# Patient Record
Sex: Female | Born: 1955 | Race: White | Hispanic: No | Marital: Married | Smoking: Never smoker
Health system: Southern US, Community
[De-identification: ages and names within clinical notes are randomized; demographics above are authoritative.]

## PROBLEM LIST (undated history)

## (undated) DIAGNOSIS — F32A Depression, unspecified: Secondary | ICD-10-CM

## (undated) DIAGNOSIS — Z9889 Other specified postprocedural states: Secondary | ICD-10-CM

## (undated) DIAGNOSIS — I4891 Unspecified atrial fibrillation: Secondary | ICD-10-CM

## (undated) DIAGNOSIS — F329 Major depressive disorder, single episode, unspecified: Secondary | ICD-10-CM

## (undated) DIAGNOSIS — E785 Hyperlipidemia, unspecified: Secondary | ICD-10-CM

## (undated) DIAGNOSIS — K219 Gastro-esophageal reflux disease without esophagitis: Secondary | ICD-10-CM

## (undated) DIAGNOSIS — S0990XA Unspecified injury of head, initial encounter: Secondary | ICD-10-CM

## (undated) DIAGNOSIS — Z8669 Personal history of other diseases of the nervous system and sense organs: Secondary | ICD-10-CM

## (undated) DIAGNOSIS — M1990 Unspecified osteoarthritis, unspecified site: Secondary | ICD-10-CM

## (undated) DIAGNOSIS — T7840XA Allergy, unspecified, initial encounter: Secondary | ICD-10-CM

## (undated) DIAGNOSIS — I219 Acute myocardial infarction, unspecified: Secondary | ICD-10-CM

## (undated) HISTORY — DX: Allergy, unspecified, initial encounter: T78.40XA

## (undated) HISTORY — DX: Hyperlipidemia, unspecified: E78.5

## (undated) HISTORY — DX: Unspecified atrial fibrillation: I48.91

## (undated) HISTORY — PX: SPINE SURGERY: SHX786

## (undated) HISTORY — DX: Major depressive disorder, single episode, unspecified: F32.9

## (undated) HISTORY — DX: Personal history of other diseases of the nervous system and sense organs: Z86.69

## (undated) HISTORY — PX: CHOLECYSTECTOMY: SHX55

## (undated) HISTORY — DX: Gastro-esophageal reflux disease without esophagitis: K21.9

## (undated) HISTORY — PX: TUBAL LIGATION: SHX77

## (undated) HISTORY — DX: Other specified postprocedural states: Z98.890

## (undated) HISTORY — DX: Unspecified injury of head, initial encounter: S09.90XA

## (undated) HISTORY — DX: Depression, unspecified: F32.A

## (undated) HISTORY — DX: Unspecified osteoarthritis, unspecified site: M19.90

## (undated) HISTORY — DX: Acute myocardial infarction, unspecified: I21.9

---

## 1994-03-03 HISTORY — PX: OTHER SURGICAL HISTORY: SHX169

## 2003-04-27 ENCOUNTER — Ambulatory Visit (HOSPITAL_COMMUNITY): Admission: RE | Admit: 2003-04-27 | Discharge: 2003-04-28 | Payer: Self-pay | Admitting: Neurological Surgery

## 2003-06-05 ENCOUNTER — Encounter: Admission: RE | Admit: 2003-06-05 | Discharge: 2003-06-05 | Payer: Self-pay | Admitting: Neurological Surgery

## 2003-07-25 ENCOUNTER — Encounter: Admission: RE | Admit: 2003-07-25 | Discharge: 2003-07-25 | Payer: Self-pay | Admitting: Neurological Surgery

## 2004-03-11 ENCOUNTER — Ambulatory Visit: Payer: Self-pay | Admitting: Internal Medicine

## 2005-03-12 ENCOUNTER — Ambulatory Visit: Payer: Self-pay | Admitting: Internal Medicine

## 2006-05-21 ENCOUNTER — Ambulatory Visit: Payer: Self-pay | Admitting: Internal Medicine

## 2006-06-18 ENCOUNTER — Ambulatory Visit: Payer: Self-pay | Admitting: Cardiology

## 2007-08-10 ENCOUNTER — Ambulatory Visit: Payer: Self-pay | Admitting: Internal Medicine

## 2008-07-20 ENCOUNTER — Ambulatory Visit: Payer: Self-pay | Admitting: Internal Medicine

## 2008-08-15 ENCOUNTER — Ambulatory Visit: Payer: Self-pay | Admitting: Internal Medicine

## 2008-10-24 ENCOUNTER — Ambulatory Visit: Payer: Self-pay | Admitting: Unknown Physician Specialty

## 2010-02-19 ENCOUNTER — Ambulatory Visit: Payer: Self-pay | Admitting: Internal Medicine

## 2010-03-26 ENCOUNTER — Ambulatory Visit: Payer: Self-pay | Admitting: Internal Medicine

## 2010-04-10 ENCOUNTER — Ambulatory Visit: Payer: Self-pay | Admitting: Internal Medicine

## 2010-08-13 LAB — HM PAP SMEAR: HM Pap smear: NEGATIVE

## 2011-04-08 ENCOUNTER — Ambulatory Visit: Payer: Self-pay | Admitting: Internal Medicine

## 2011-04-08 LAB — HM MAMMOGRAPHY

## 2011-12-02 ENCOUNTER — Telehealth: Payer: Self-pay | Admitting: Internal Medicine

## 2011-12-02 NOTE — Telephone Encounter (Signed)
Was in [facility] for 5 days for afib  And she did a drug load of Tikosyn 500 mgs and the want blood work drawn concentrating on potassioum, magnesium and kidney function.  Was discharged on 9/28

## 2011-12-02 NOTE — Telephone Encounter (Signed)
Patient will try to have her blood work done through another avenue.

## 2012-04-13 ENCOUNTER — Encounter: Payer: Self-pay | Admitting: Internal Medicine

## 2012-04-19 ENCOUNTER — Ambulatory Visit (INDEPENDENT_AMBULATORY_CARE_PROVIDER_SITE_OTHER): Payer: BC Managed Care – PPO | Admitting: Internal Medicine

## 2012-04-19 ENCOUNTER — Encounter: Payer: Self-pay | Admitting: Internal Medicine

## 2012-04-19 VITALS — BP 120/78 | HR 62 | Temp 98.5°F | Ht 67.0 in | Wt 224.0 lb

## 2012-04-19 DIAGNOSIS — I4891 Unspecified atrial fibrillation: Secondary | ICD-10-CM

## 2012-04-19 DIAGNOSIS — F329 Major depressive disorder, single episode, unspecified: Secondary | ICD-10-CM

## 2012-04-19 DIAGNOSIS — E7800 Pure hypercholesterolemia, unspecified: Secondary | ICD-10-CM

## 2012-04-19 DIAGNOSIS — F3289 Other specified depressive episodes: Secondary | ICD-10-CM

## 2012-04-19 DIAGNOSIS — Z1239 Encounter for other screening for malignant neoplasm of breast: Secondary | ICD-10-CM

## 2012-04-19 MED ORDER — FLUOXETINE HCL 90 MG PO CPDR: 90.0000 mg | DELAYED_RELEASE_CAPSULE | ORAL | Status: DC

## 2012-04-21 ENCOUNTER — Encounter: Payer: Self-pay | Admitting: Internal Medicine

## 2012-04-21 DIAGNOSIS — I4891 Unspecified atrial fibrillation: Secondary | ICD-10-CM | POA: Insufficient documentation

## 2012-04-21 DIAGNOSIS — E7800 Pure hypercholesterolemia, unspecified: Secondary | ICD-10-CM | POA: Insufficient documentation

## 2012-04-21 NOTE — Assessment & Plan Note (Signed)
Low cholesterol diet and exercise.  Follow lipid panel.  Obtain recent lab results.

## 2012-04-21 NOTE — Assessment & Plan Note (Signed)
On prozac and doing well.  Follow.   

## 2012-04-21 NOTE — Assessment & Plan Note (Signed)
On Xarelto and Tikosyn.  Also on Toprol.  Just saw Dr Maisie Fus.  Declines repeat ablation at this time.  Follow.  Continues follow up with cardiology.
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
## 2012-04-29 ENCOUNTER — Telehealth: Payer: Self-pay | Admitting: *Deleted

## 2012-04-29 NOTE — Telephone Encounter (Signed)
With the new guidelines - I am not sure that she needs prophylactic abx.  She can call cardiology to confirm.

## 2012-04-29 NOTE — Telephone Encounter (Signed)
Patient notified. Patient notified.

## 2012-04-29 NOTE — Telephone Encounter (Signed)
Patient is going to a new dentist and would like some abx called in to pharmacy. Usually send in Amoxicillin for her prior to appointment  Rennie Plowman, [place]  Phone [phone]

## 2012-05-27 ENCOUNTER — Ambulatory Visit: Payer: Self-pay | Admitting: Internal Medicine

## 2012-05-29 ENCOUNTER — Encounter: Payer: Self-pay | Admitting: Internal Medicine

## 2012-06-15 ENCOUNTER — Encounter: Payer: Self-pay | Admitting: Internal Medicine

## 2012-07-29 ENCOUNTER — Encounter: Payer: Self-pay | Admitting: Internal Medicine

## 2012-07-29 ENCOUNTER — Ambulatory Visit (INDEPENDENT_AMBULATORY_CARE_PROVIDER_SITE_OTHER): Payer: BC Managed Care – PPO | Admitting: Internal Medicine

## 2012-07-29 ENCOUNTER — Other Ambulatory Visit (HOSPITAL_COMMUNITY)
Admission: RE | Admit: 2012-07-29 | Discharge: 2012-07-29 | Disposition: A | Discharge status: Home / Self Care | Payer: BC Managed Care – PPO | Source: Ambulatory Visit | Attending: Internal Medicine | Admitting: Internal Medicine

## 2012-07-29 VITALS — BP 104/60 | HR 61 | Temp 98.5°F | Ht 68.0 in | Wt 222.5 lb

## 2012-07-29 DIAGNOSIS — Z1151 Encounter for screening for human papillomavirus (HPV): Secondary | ICD-10-CM | POA: Insufficient documentation

## 2012-07-29 DIAGNOSIS — Z1211 Encounter for screening for malignant neoplasm of colon: Secondary | ICD-10-CM

## 2012-07-29 DIAGNOSIS — Z124 Encounter for screening for malignant neoplasm of cervix: Secondary | ICD-10-CM

## 2012-07-29 DIAGNOSIS — R109 Unspecified abdominal pain: Secondary | ICD-10-CM

## 2012-07-29 DIAGNOSIS — F329 Major depressive disorder, single episode, unspecified: Secondary | ICD-10-CM

## 2012-07-29 DIAGNOSIS — Z01419 Encounter for gynecological examination (general) (routine) without abnormal findings: Secondary | ICD-10-CM | POA: Insufficient documentation

## 2012-07-29 DIAGNOSIS — E7800 Pure hypercholesterolemia, unspecified: Secondary | ICD-10-CM

## 2012-07-29 DIAGNOSIS — I4891 Unspecified atrial fibrillation: Secondary | ICD-10-CM

## 2012-07-29 MED ORDER — PANTOPRAZOLE SODIUM 40 MG PO TBEC: 40.0000 mg | DELAYED_RELEASE_TABLET | Freq: Every day | ORAL | Status: DC

## 2012-08-01 ENCOUNTER — Encounter: Payer: Self-pay | Admitting: Internal Medicine

## 2012-08-01 NOTE — Assessment & Plan Note (Signed)
Low cholesterol diet and exercise.  Follow lipid panel.  Obtain recent lab results.
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
## 2012-08-01 NOTE — Assessment & Plan Note (Signed)
On Xarelto and Tikosyn.  Also on Toprol.  Sees Dr Maisie Fus.  Has discussed with him regarding another ablation.  She has previously declined. Contemplating now.  Continues to follow up with cardiology.

## 2012-08-01 NOTE — Assessment & Plan Note (Signed)
On prozac and doing well.  Follow.   

## 2012-08-02 ENCOUNTER — Ambulatory Visit: Payer: Self-pay | Admitting: Internal Medicine

## 2012-08-03 ENCOUNTER — Encounter: Payer: Self-pay | Admitting: *Deleted

## 2012-08-11 ENCOUNTER — Telehealth: Payer: Self-pay | Admitting: *Deleted

## 2012-08-11 DIAGNOSIS — R109 Unspecified abdominal pain: Secondary | ICD-10-CM

## 2012-08-11 DIAGNOSIS — K760 Fatty (change of) liver, not elsewhere classified: Secondary | ICD-10-CM

## 2012-08-11 NOTE — Telephone Encounter (Signed)
Pt informed of Abdominal U/S results- U/S showed a fatty liver. Also a possible non obstructing right kidney stone. Pt willing to proceed with the GI referral to further evaluate the abdominal pain & fatty liver. Pt has seen Dr. Mechele Collin at [facility] in the past & would like to see him again if possible. **Needs GI referral**

## 2012-08-12 NOTE — Telephone Encounter (Signed)
Order placed for GI referral.   

## 2012-09-01 ENCOUNTER — Encounter: Payer: Self-pay | Admitting: Internal Medicine

## 2012-09-16 ENCOUNTER — Encounter: Payer: Self-pay | Admitting: Internal Medicine

## 2012-09-28 ENCOUNTER — Encounter: Payer: Self-pay | Admitting: Internal Medicine

## 2012-09-28 ENCOUNTER — Ambulatory Visit (INDEPENDENT_AMBULATORY_CARE_PROVIDER_SITE_OTHER): Payer: BC Managed Care – PPO | Admitting: Internal Medicine

## 2012-09-28 VITALS — BP 110/64 | HR 61 | Temp 98.1°F | Ht 68.0 in | Wt 222.5 lb

## 2012-09-28 DIAGNOSIS — K760 Fatty (change of) liver, not elsewhere classified: Secondary | ICD-10-CM

## 2012-09-28 DIAGNOSIS — I4891 Unspecified atrial fibrillation: Secondary | ICD-10-CM

## 2012-09-28 DIAGNOSIS — K7689 Other specified diseases of liver: Secondary | ICD-10-CM

## 2012-09-28 DIAGNOSIS — F329 Major depressive disorder, single episode, unspecified: Secondary | ICD-10-CM

## 2012-09-28 DIAGNOSIS — E7800 Pure hypercholesterolemia, unspecified: Secondary | ICD-10-CM

## 2012-10-02 ENCOUNTER — Encounter: Payer: Self-pay | Admitting: Internal Medicine

## 2012-10-02 DIAGNOSIS — K760 Fatty (change of) liver, not elsewhere classified: Secondary | ICD-10-CM | POA: Insufficient documentation

## 2012-10-02 NOTE — Assessment & Plan Note (Signed)
On prozac and doing well.  Follow.   
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
## 2012-10-02 NOTE — Assessment & Plan Note (Signed)
Low cholesterol diet and exercise.  Follow lipid panel.  Check cholesterol.

## 2012-10-02 NOTE — Assessment & Plan Note (Signed)
On Xarelto and Tikosyn.  Also on Toprol.  Sees Dr Maisie Fus.  Has discussed with him regarding another ablation.  She has previously declined. Contemplating now.  Continues to follow up with cardiology.

## 2012-10-02 NOTE — Assessment & Plan Note (Signed)
Abdominal ultrasound revealed changes c/w fatty liver.  Check liver panel.  Discussed the need for hepatitis vaccines.

## 2012-10-14 ENCOUNTER — Other Ambulatory Visit (INDEPENDENT_AMBULATORY_CARE_PROVIDER_SITE_OTHER): Payer: BC Managed Care – PPO

## 2012-10-14 DIAGNOSIS — E7800 Pure hypercholesterolemia, unspecified: Secondary | ICD-10-CM

## 2012-10-14 DIAGNOSIS — K760 Fatty (change of) liver, not elsewhere classified: Secondary | ICD-10-CM

## 2012-10-14 DIAGNOSIS — K7689 Other specified diseases of liver: Secondary | ICD-10-CM

## 2012-10-14 DIAGNOSIS — I4891 Unspecified atrial fibrillation: Secondary | ICD-10-CM

## 2012-10-14 LAB — TSH: TSH: 1.68 u[IU]/mL (ref 0.35–5.50)

## 2012-10-14 LAB — COMPREHENSIVE METABOLIC PANEL
ALT: 25 U/L (ref 0–35)
AST: 24 U/L (ref 0–37)
Albumin: 4 g/dL (ref 3.5–5.2)
Alkaline Phosphatase: 89 U/L (ref 39–117)
BUN: 15 mg/dL (ref 6–23)
CO2: 27 mEq/L (ref 19–32)
Calcium: 9.2 mg/dL (ref 8.4–10.5)
Chloride: 101 mEq/L (ref 96–112)
Creatinine, Ser: 0.7 mg/dL (ref 0.4–1.2)
GFR: 95.69 mL/min (ref >60.00)
Glucose, Bld: 91 mg/dL (ref 70–99)
Potassium: 4.5 mEq/L (ref 3.5–5.1)
Sodium: 136 mEq/L (ref 135–145)
Total Bilirubin: 0.6 mg/dL (ref 0.3–1.2)
Total Protein: 7.4 g/dL (ref 6.0–8.3)

## 2012-10-14 LAB — CBC WITH DIFFERENTIAL/PLATELET
Basophils Absolute: 0.1 10*3/uL (ref 0.0–0.1)
Basophils Relative: 1.2 % (ref 0.0–3.0)
Eosinophils Absolute: 0.2 10*3/uL (ref 0.0–0.7)
Eosinophils Relative: 2.3 % (ref 0.0–5.0)
HCT: 39.7 % (ref 36.0–46.0)
Hemoglobin: 13.4 g/dL (ref 12.0–15.0)
Lymphocytes Relative: 26.8 % (ref 12.0–46.0)
Lymphs Abs: 1.8 10*3/uL (ref 0.7–4.0)
MCHC: 33.8 g/dL (ref 30.0–36.0)
MCV: 86.7 fl (ref 78.0–100.0)
Monocytes Absolute: 0.6 10*3/uL (ref 0.1–1.0)
Monocytes Relative: 8.8 % (ref 3.0–12.0)
Neutro Abs: 4.1 10*3/uL (ref 1.4–7.7)
Neutrophils Relative %: 60.9 % (ref 43.0–77.0)
Platelets: 261 10*3/uL (ref 150.0–400.0)
RBC: 4.58 Mil/uL (ref 3.87–5.11)
RDW: 13.8 % (ref 11.5–14.6)
WBC: 6.7 10*3/uL (ref 4.5–10.5)

## 2012-10-14 LAB — LIPID PANEL
Cholesterol: 199 mg/dL (ref 0–200)
HDL: 59.3 mg/dL (ref >39.00)
LDL Cholesterol: 112 mg/dL — ABNORMAL HIGH (ref 0–99)
Total CHOL/HDL Ratio: 3
Triglycerides: 137 mg/dL (ref 0.0–149.0)
VLDL: 27.4 mg/dL (ref 0.0–40.0)

## 2012-10-15 ENCOUNTER — Encounter: Payer: Self-pay | Admitting: *Deleted

## 2012-10-19 ENCOUNTER — Encounter: Payer: BC Managed Care – PPO | Admitting: Internal Medicine

## 2012-10-21 ENCOUNTER — Encounter: Payer: BC Managed Care – PPO | Admitting: Internal Medicine

## 2012-12-13 ENCOUNTER — Other Ambulatory Visit: Payer: Self-pay | Admitting: Internal Medicine

## 2012-12-13 NOTE — Telephone Encounter (Signed)
Refilled weekly prozac #4 with 5 refills.

## 2012-12-13 NOTE — Telephone Encounter (Signed)
Okay to refill? 

## 2013-02-01 ENCOUNTER — Encounter: Payer: Self-pay | Admitting: Internal Medicine

## 2013-02-01 ENCOUNTER — Ambulatory Visit (INDEPENDENT_AMBULATORY_CARE_PROVIDER_SITE_OTHER): Payer: BC Managed Care – PPO | Admitting: Internal Medicine

## 2013-02-01 ENCOUNTER — Encounter (INDEPENDENT_AMBULATORY_CARE_PROVIDER_SITE_OTHER): Payer: Self-pay

## 2013-02-01 VITALS — BP 110/70 | HR 66 | Temp 98.0°F | Ht 68.0 in | Wt 221.5 lb

## 2013-02-01 DIAGNOSIS — E7800 Pure hypercholesterolemia, unspecified: Secondary | ICD-10-CM

## 2013-02-01 DIAGNOSIS — K760 Fatty (change of) liver, not elsewhere classified: Secondary | ICD-10-CM

## 2013-02-01 DIAGNOSIS — K7689 Other specified diseases of liver: Secondary | ICD-10-CM

## 2013-02-01 DIAGNOSIS — I4891 Unspecified atrial fibrillation: Secondary | ICD-10-CM

## 2013-02-01 DIAGNOSIS — R112 Nausea with vomiting, unspecified: Secondary | ICD-10-CM

## 2013-02-01 DIAGNOSIS — F329 Major depressive disorder, single episode, unspecified: Secondary | ICD-10-CM

## 2013-02-01 MED ORDER — PANTOPRAZOLE SODIUM 40 MG PO TBEC: 40.0000 mg | DELAYED_RELEASE_TABLET | Freq: Every day | ORAL | Status: DC
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
## 2013-02-01 NOTE — Progress Notes (Signed)
Pre-visit discussion using our clinic review tool. No additional management support is needed unless otherwise documented below in the visit note.  

## 2013-02-05 ENCOUNTER — Encounter: Payer: Self-pay | Admitting: Internal Medicine

## 2013-02-05 DIAGNOSIS — R112 Nausea with vomiting, unspecified: Secondary | ICD-10-CM | POA: Insufficient documentation

## 2013-02-05 NOTE — Assessment & Plan Note (Signed)
Low cholesterol diet and exercise.  Follow lipid panel.   

## 2013-02-05 NOTE — Assessment & Plan Note (Signed)
Symptoms improved on protonix.  Has had an abdominal ultrasound.  No acute abnormality.  Has seen GI.  Will restart protonix.  Notify me if persistent problems.  She declines further evaluation at this point.

## 2013-02-05 NOTE — Assessment & Plan Note (Signed)
On prozac and doing well.  Follow.   

## 2013-02-05 NOTE — Assessment & Plan Note (Signed)
Abdominal ultrasound revealed changes c/w fatty liver.  Follow liver panel.  Discussed the need for hepatitis vaccines.

## 2013-02-05 NOTE — Assessment & Plan Note (Signed)
On Xarelto and Tikosyn.  Also on Toprol.  Sees Dr Maisie Fus.  Has discussed with him regarding another ablation.  She declines.  Continues to follow up with cardiology.

## 2013-05-30 ENCOUNTER — Other Ambulatory Visit: Payer: Self-pay | Admitting: Internal Medicine

## 2013-08-24 ENCOUNTER — Other Ambulatory Visit: Payer: Self-pay | Admitting: Internal Medicine

## 2013-08-25 NOTE — Telephone Encounter (Signed)
Last OV 12.2.14, last refill 5.26.15.  Please advise refill.

## 2013-08-25 NOTE — Telephone Encounter (Signed)
Refilled prozac #4 with one refill.

## 2013-10-03 ENCOUNTER — Encounter (INDEPENDENT_AMBULATORY_CARE_PROVIDER_SITE_OTHER): Payer: Self-pay

## 2013-10-03 ENCOUNTER — Encounter: Payer: Self-pay | Admitting: Internal Medicine

## 2013-10-03 ENCOUNTER — Ambulatory Visit (INDEPENDENT_AMBULATORY_CARE_PROVIDER_SITE_OTHER): Payer: No Typology Code available for payment source | Admitting: Internal Medicine

## 2013-10-03 VITALS — BP 118/70 | HR 60 | Temp 98.1°F | Ht 67.0 in | Wt 222.0 lb

## 2013-10-03 DIAGNOSIS — Z1239 Encounter for other screening for malignant neoplasm of breast: Secondary | ICD-10-CM

## 2013-10-03 DIAGNOSIS — K760 Fatty (change of) liver, not elsewhere classified: Secondary | ICD-10-CM

## 2013-10-03 DIAGNOSIS — R5383 Other fatigue: Secondary | ICD-10-CM

## 2013-10-03 DIAGNOSIS — E669 Obesity, unspecified: Secondary | ICD-10-CM

## 2013-10-03 DIAGNOSIS — F3289 Other specified depressive episodes: Secondary | ICD-10-CM

## 2013-10-03 DIAGNOSIS — R112 Nausea with vomiting, unspecified: Secondary | ICD-10-CM

## 2013-10-03 DIAGNOSIS — I4891 Unspecified atrial fibrillation: Secondary | ICD-10-CM

## 2013-10-03 DIAGNOSIS — K7689 Other specified diseases of liver: Secondary | ICD-10-CM

## 2013-10-03 DIAGNOSIS — E7800 Pure hypercholesterolemia, unspecified: Secondary | ICD-10-CM

## 2013-10-03 DIAGNOSIS — R5381 Other malaise: Secondary | ICD-10-CM

## 2013-10-03 DIAGNOSIS — F32A Depression, unspecified: Secondary | ICD-10-CM

## 2013-10-03 DIAGNOSIS — F329 Major depressive disorder, single episode, unspecified: Secondary | ICD-10-CM

## 2013-10-03 MED ORDER — FLUOXETINE HCL 90 MG PO CPDR: 90.0000 mg | DELAYED_RELEASE_CAPSULE | ORAL | Status: DC

## 2013-10-03 NOTE — Progress Notes (Signed)
Pre visit review using our clinic review tool, if applicable. No additional management support is needed unless otherwise documented below in the visit note. 

## 2013-10-04 ENCOUNTER — Encounter: Payer: Self-pay | Admitting: Internal Medicine

## 2013-10-04 DIAGNOSIS — E669 Obesity, unspecified: Secondary | ICD-10-CM | POA: Insufficient documentation

## 2013-10-04 NOTE — Assessment & Plan Note (Signed)
Low cholesterol diet and exercise.  Follow lipid panel.   

## 2013-10-04 NOTE — Assessment & Plan Note (Signed)
Abdominal ultrasound revealed changes c/w fatty liver.  Follow liver panel.  Have discussed the need for hepatitis vaccines, diet and exercise.

## 2013-10-04 NOTE — Assessment & Plan Note (Signed)
On prozac and doing well.  Follow.   

## 2013-10-04 NOTE — Assessment & Plan Note (Signed)
Symptoms improved on protonix.  Has had an abdominal ultrasound.  No acute abnormality.  Has seen GI.  Will restart protonix.  When on protonix, symptoms appear to be better controlled.  Some reflux.  When off protonix, symptoms worse.  She is due f/u colonoscopy.  Will refer for possible EGD to be done at the same time.

## 2013-10-04 NOTE — Assessment & Plan Note (Signed)
Have discussed diet, exercise and weight loss.   
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
## 2013-10-04 NOTE — Assessment & Plan Note (Signed)
On Xarelto and Tikosyn.  Also on Toprol.  Has seen Dr Maisie Fushomas.  Has discussed with him regarding another ablation.  She has previously declined.  Given ongoing symptoms, we discussed referral back.  States she will think about pursuing.   Continues to follow up with cardiology.  Planned f/u in 10/15 with ECHO.

## 2013-10-11 ENCOUNTER — Other Ambulatory Visit (INDEPENDENT_AMBULATORY_CARE_PROVIDER_SITE_OTHER): Payer: No Typology Code available for payment source

## 2013-10-11 DIAGNOSIS — E7800 Pure hypercholesterolemia, unspecified: Secondary | ICD-10-CM

## 2013-10-11 DIAGNOSIS — I4891 Unspecified atrial fibrillation: Secondary | ICD-10-CM

## 2013-10-11 DIAGNOSIS — R5381 Other malaise: Secondary | ICD-10-CM

## 2013-10-11 DIAGNOSIS — R5383 Other fatigue: Secondary | ICD-10-CM

## 2013-10-11 LAB — COMPREHENSIVE METABOLIC PANEL
ALT: 37 U/L — ABNORMAL HIGH (ref 0–35)
AST: 28 U/L (ref 0–37)
Albumin: 4.1 g/dL (ref 3.5–5.2)
Alkaline Phosphatase: 92 U/L (ref 39–117)
BUN: 12 mg/dL (ref 6–23)
CO2: 25 mEq/L (ref 19–32)
Calcium: 9.3 mg/dL (ref 8.4–10.5)
Chloride: 101 mEq/L (ref 96–112)
Creatinine, Ser: 0.7 mg/dL (ref 0.4–1.2)
GFR: 90.67 mL/min (ref >60.00)
Glucose, Bld: 88 mg/dL (ref 70–99)
Potassium: 4.6 mEq/L (ref 3.5–5.1)
Sodium: 136 mEq/L (ref 135–145)
Total Bilirubin: 0.6 mg/dL (ref 0.2–1.2)
Total Protein: 7 g/dL (ref 6.0–8.3)

## 2013-10-11 LAB — CBC WITH DIFFERENTIAL/PLATELET
Basophils Absolute: 0 10*3/uL (ref 0.0–0.1)
Basophils Relative: 0.5 % (ref 0.0–3.0)
Eosinophils Absolute: 0.1 10*3/uL (ref 0.0–0.7)
Eosinophils Relative: 1.7 % (ref 0.0–5.0)
HCT: 39.6 % (ref 36.0–46.0)
Hemoglobin: 13.2 g/dL (ref 12.0–15.0)
Lymphocytes Relative: 28.1 % (ref 12.0–46.0)
Lymphs Abs: 1.7 10*3/uL (ref 0.7–4.0)
MCHC: 33.3 g/dL (ref 30.0–36.0)
MCV: 86.6 fl (ref 78.0–100.0)
Monocytes Absolute: 0.5 10*3/uL (ref 0.1–1.0)
Monocytes Relative: 8.2 % (ref 3.0–12.0)
Neutro Abs: 3.8 10*3/uL (ref 1.4–7.7)
Neutrophils Relative %: 61.5 % (ref 43.0–77.0)
Platelets: 253 10*3/uL (ref 150.0–400.0)
RBC: 4.57 Mil/uL (ref 3.87–5.11)
RDW: 14.4 % (ref 11.5–15.5)
WBC: 6.1 10*3/uL (ref 4.0–10.5)

## 2013-10-11 LAB — TSH: TSH: 1.48 u[IU]/mL (ref 0.35–4.50)

## 2013-10-11 LAB — LIPID PANEL
Cholesterol: 222 mg/dL — ABNORMAL HIGH (ref 0–200)
HDL: 51.6 mg/dL (ref >39.00)
LDL Cholesterol: 137 mg/dL — ABNORMAL HIGH (ref 0–99)
NonHDL: 170.4
Total CHOL/HDL Ratio: 4
Triglycerides: 165 mg/dL — ABNORMAL HIGH (ref 0.0–149.0)
VLDL: 33 mg/dL (ref 0.0–40.0)

## 2013-10-12 ENCOUNTER — Other Ambulatory Visit: Payer: Self-pay | Admitting: Internal Medicine

## 2013-10-12 ENCOUNTER — Encounter: Payer: Self-pay | Admitting: *Deleted

## 2013-10-12 DIAGNOSIS — R945 Abnormal results of liver function studies: Secondary | ICD-10-CM

## 2013-10-12 DIAGNOSIS — R7989 Other specified abnormal findings of blood chemistry: Secondary | ICD-10-CM

## 2013-10-12 NOTE — Progress Notes (Signed)
Order placed for f/u liver panel.  

## 2013-10-14 ENCOUNTER — Encounter: Payer: Self-pay | Admitting: Internal Medicine

## 2013-11-02 ENCOUNTER — Other Ambulatory Visit: Payer: No Typology Code available for payment source

## 2013-11-14 ENCOUNTER — Other Ambulatory Visit (INDEPENDENT_AMBULATORY_CARE_PROVIDER_SITE_OTHER): Payer: No Typology Code available for payment source

## 2013-11-14 DIAGNOSIS — R945 Abnormal results of liver function studies: Secondary | ICD-10-CM

## 2013-11-14 DIAGNOSIS — R7989 Other specified abnormal findings of blood chemistry: Secondary | ICD-10-CM

## 2013-11-14 LAB — HEPATIC FUNCTION PANEL
ALT: 36 U/L — ABNORMAL HIGH (ref 0–35)
AST: 32 U/L (ref 0–37)
Albumin: 4 g/dL (ref 3.5–5.2)
Alkaline Phosphatase: 101 U/L (ref 39–117)
Bilirubin, Direct: 0 mg/dL (ref 0.0–0.3)
Total Bilirubin: 0.5 mg/dL (ref 0.2–1.2)
Total Protein: 7.6 g/dL (ref 6.0–8.3)

## 2013-11-21 ENCOUNTER — Other Ambulatory Visit: Payer: Self-pay | Admitting: Internal Medicine

## 2013-11-21 ENCOUNTER — Encounter: Payer: Self-pay | Admitting: *Deleted

## 2013-11-21 DIAGNOSIS — R7989 Other specified abnormal findings of blood chemistry: Secondary | ICD-10-CM

## 2013-11-21 DIAGNOSIS — R945 Abnormal results of liver function studies: Secondary | ICD-10-CM

## 2013-11-21 NOTE — Progress Notes (Signed)
Order placed for f/u liver panel.  

## 2014-01-23 ENCOUNTER — Other Ambulatory Visit (INDEPENDENT_AMBULATORY_CARE_PROVIDER_SITE_OTHER): Payer: No Typology Code available for payment source

## 2014-01-23 DIAGNOSIS — R945 Abnormal results of liver function studies: Secondary | ICD-10-CM

## 2014-01-23 DIAGNOSIS — R7989 Other specified abnormal findings of blood chemistry: Secondary | ICD-10-CM

## 2014-01-23 LAB — HEPATIC FUNCTION PANEL
ALT: 34 U/L (ref 0–35)
AST: 31 U/L (ref 0–37)
Albumin: 4.2 g/dL (ref 3.5–5.2)
Alkaline Phosphatase: 101 U/L (ref 39–117)
Bilirubin, Direct: 0.1 mg/dL (ref 0.0–0.3)
Total Bilirubin: 0.4 mg/dL (ref 0.2–1.2)
Total Protein: 7.1 g/dL (ref 6.0–8.3)

## 2014-01-24 ENCOUNTER — Other Ambulatory Visit: Payer: No Typology Code available for payment source

## 2014-01-24 ENCOUNTER — Encounter: Payer: Self-pay | Admitting: *Deleted

## 2014-02-20 ENCOUNTER — Ambulatory Visit: Payer: Self-pay | Admitting: Unknown Physician Specialty

## 2014-02-20 LAB — HM COLONOSCOPY

## 2014-03-10 ENCOUNTER — Encounter: Payer: Self-pay | Admitting: Internal Medicine

## 2014-04-02 ENCOUNTER — Encounter: Payer: Self-pay | Admitting: Internal Medicine

## 2014-04-02 DIAGNOSIS — K5730 Diverticulosis of large intestine without perforation or abscess without bleeding: Secondary | ICD-10-CM | POA: Insufficient documentation

## 2014-04-04 ENCOUNTER — Encounter: Payer: Self-pay | Admitting: Internal Medicine

## 2014-04-04 ENCOUNTER — Ambulatory Visit (INDEPENDENT_AMBULATORY_CARE_PROVIDER_SITE_OTHER): Payer: No Typology Code available for payment source | Admitting: Internal Medicine

## 2014-04-04 VITALS — BP 106/70 | HR 88 | Temp 98.2°F | Ht 67.0 in | Wt 217.2 lb

## 2014-04-04 DIAGNOSIS — H5789 Other specified disorders of eye and adnexa: Secondary | ICD-10-CM

## 2014-04-04 DIAGNOSIS — F329 Major depressive disorder, single episode, unspecified: Secondary | ICD-10-CM

## 2014-04-04 DIAGNOSIS — K760 Fatty (change of) liver, not elsewhere classified: Secondary | ICD-10-CM

## 2014-04-04 DIAGNOSIS — E7800 Pure hypercholesterolemia, unspecified: Secondary | ICD-10-CM

## 2014-04-04 DIAGNOSIS — H578 Other specified disorders of eye and adnexa: Secondary | ICD-10-CM

## 2014-04-04 DIAGNOSIS — K5730 Diverticulosis of large intestine without perforation or abscess without bleeding: Secondary | ICD-10-CM

## 2014-04-04 DIAGNOSIS — Z1239 Encounter for other screening for malignant neoplasm of breast: Secondary | ICD-10-CM

## 2014-04-04 DIAGNOSIS — I4891 Unspecified atrial fibrillation: Secondary | ICD-10-CM

## 2014-04-04 DIAGNOSIS — F32A Depression, unspecified: Secondary | ICD-10-CM

## 2014-04-04 DIAGNOSIS — H5712 Ocular pain, left eye: Secondary | ICD-10-CM

## 2014-04-04 DIAGNOSIS — E669 Obesity, unspecified: Secondary | ICD-10-CM

## 2014-04-04 NOTE — Progress Notes (Signed)
Pre visit review using our clinic review tool, if applicable. No additional management support is needed unless otherwise documented below in the visit note. 

## 2014-04-07 ENCOUNTER — Encounter: Payer: Self-pay | Admitting: Internal Medicine

## 2014-04-07 DIAGNOSIS — E669 Obesity, unspecified: Secondary | ICD-10-CM | POA: Insufficient documentation

## 2014-04-07 DIAGNOSIS — H5789 Other specified disorders of eye and adnexa: Secondary | ICD-10-CM | POA: Insufficient documentation

## 2014-04-07 NOTE — Assessment & Plan Note (Signed)
Doing well on prozac.  Follow  

## 2014-04-07 NOTE — Assessment & Plan Note (Signed)
Persistent issues.  Seeing Dr Maureen RalphsVivian.  Planning for ablation 04/12/14.

## 2014-04-07 NOTE — Assessment & Plan Note (Signed)
Abdominal ultrasound - changes c/w fatty liver.  Check liver panel.  Diet and exercise.

## 2014-04-07 NOTE — Assessment & Plan Note (Signed)
Diet and exercise.   
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
## 2014-04-07 NOTE — Assessment & Plan Note (Signed)
Redness and swelling s/p splash exposure - dog ear cleaner.  She has rinsed her eye.  Still with some irritation and haziness.  Have ophthalmology evaluate.

## 2014-04-07 NOTE — Assessment & Plan Note (Signed)
Low cholesterol diet and exercise.  Check lipid panel.   

## 2014-04-07 NOTE — Assessment & Plan Note (Signed)
Colonoscopy 02/20/14 - diverticulosis and internal hemorrhoids.  F/u colonoscopy in 2020.  Bowels better.

## 2014-05-02 ENCOUNTER — Ambulatory Visit: Payer: Self-pay | Admitting: Internal Medicine

## 2014-05-02 LAB — HM MAMMOGRAPHY: HM Mammogram: NEGATIVE

## 2014-05-15 ENCOUNTER — Other Ambulatory Visit: Payer: Self-pay | Admitting: *Deleted

## 2014-05-15 MED ORDER — FLUOXETINE HCL 90 MG PO CPDR: 90.0000 mg | DELAYED_RELEASE_CAPSULE | ORAL | Status: DC

## 2014-06-26 LAB — SURGICAL PATHOLOGY

## 2014-08-03 ENCOUNTER — Other Ambulatory Visit: Payer: Self-pay | Admitting: Internal Medicine

## 2014-10-03 ENCOUNTER — Encounter: Payer: No Typology Code available for payment source | Admitting: Internal Medicine

## 2014-10-06 ENCOUNTER — Encounter: Payer: Self-pay | Admitting: Internal Medicine

## 2014-10-06 ENCOUNTER — Ambulatory Visit (INDEPENDENT_AMBULATORY_CARE_PROVIDER_SITE_OTHER): Payer: PRIVATE HEALTH INSURANCE | Admitting: Internal Medicine

## 2014-10-06 VITALS — BP 110/80 | HR 52 | Temp 98.2°F | Ht 67.25 in | Wt 207.4 lb

## 2014-10-06 DIAGNOSIS — K5730 Diverticulosis of large intestine without perforation or abscess without bleeding: Secondary | ICD-10-CM

## 2014-10-06 DIAGNOSIS — I4891 Unspecified atrial fibrillation: Secondary | ICD-10-CM | POA: Diagnosis not present

## 2014-10-06 DIAGNOSIS — F32A Depression, unspecified: Secondary | ICD-10-CM

## 2014-10-06 DIAGNOSIS — F329 Major depressive disorder, single episode, unspecified: Secondary | ICD-10-CM

## 2014-10-06 DIAGNOSIS — Z0000 Encounter for general adult medical examination without abnormal findings: Secondary | ICD-10-CM

## 2014-10-06 DIAGNOSIS — K760 Fatty (change of) liver, not elsewhere classified: Secondary | ICD-10-CM

## 2014-10-06 DIAGNOSIS — E669 Obesity, unspecified: Secondary | ICD-10-CM

## 2014-10-06 DIAGNOSIS — E7800 Pure hypercholesterolemia, unspecified: Secondary | ICD-10-CM

## 2014-10-06 LAB — BASIC METABOLIC PANEL
BUN: 12 mg/dL (ref 6–23)
CO2: 27 mEq/L (ref 19–32)
Calcium: 9.3 mg/dL (ref 8.4–10.5)
Chloride: 102 mEq/L (ref 96–112)
Creatinine, Ser: 0.61 mg/dL (ref 0.40–1.20)
GFR: 105.92 mL/min (ref >60.00)
Glucose, Bld: 86 mg/dL (ref 70–99)
Potassium: 4.9 mEq/L (ref 3.5–5.1)
Sodium: 137 mEq/L (ref 135–145)

## 2014-10-06 LAB — CBC WITH DIFFERENTIAL/PLATELET
Basophils Absolute: 0 10*3/uL (ref 0.0–0.1)
Basophils Relative: 0.7 % (ref 0.0–3.0)
Eosinophils Absolute: 0.1 10*3/uL (ref 0.0–0.7)
Eosinophils Relative: 2 % (ref 0.0–5.0)
HCT: 39.7 % (ref 36.0–46.0)
Hemoglobin: 13.3 g/dL (ref 12.0–15.0)
Lymphocytes Relative: 30.8 % (ref 12.0–46.0)
Lymphs Abs: 2 10*3/uL (ref 0.7–4.0)
MCHC: 33.6 g/dL (ref 30.0–36.0)
MCV: 86 fl (ref 78.0–100.0)
Monocytes Absolute: 0.6 10*3/uL (ref 0.1–1.0)
Monocytes Relative: 8.9 % (ref 3.0–12.0)
Neutro Abs: 3.8 10*3/uL (ref 1.4–7.7)
Neutrophils Relative %: 57.6 % (ref 43.0–77.0)
Platelets: 265 10*3/uL (ref 150.0–400.0)
RBC: 4.61 Mil/uL (ref 3.87–5.11)
RDW: 14.5 % (ref 11.5–15.5)
WBC: 6.6 10*3/uL (ref 4.0–10.5)

## 2014-10-06 LAB — HEPATIC FUNCTION PANEL
ALT: 18 U/L (ref 0–35)
AST: 22 U/L (ref 0–37)
Albumin: 4.2 g/dL (ref 3.5–5.2)
Alkaline Phosphatase: 99 U/L (ref 39–117)
Bilirubin, Direct: 0.1 mg/dL (ref 0.0–0.3)
Total Bilirubin: 0.5 mg/dL (ref 0.2–1.2)
Total Protein: 6.9 g/dL (ref 6.0–8.3)

## 2014-10-06 LAB — TSH: TSH: 1.36 u[IU]/mL (ref 0.35–4.50)

## 2014-10-06 MED ORDER — FLUOXETINE HCL 10 MG PO CAPS: 10.0000 mg | ORAL_CAPSULE | Freq: Every day | ORAL | Status: DC

## 2014-10-06 NOTE — Progress Notes (Signed)
Pre visit review using our clinic review tool, if applicable. No additional management support is needed unless otherwise documented below in the visit note. 
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
## 2014-10-08 ENCOUNTER — Encounter: Payer: Self-pay | Admitting: *Deleted

## 2014-10-08 ENCOUNTER — Encounter: Payer: Self-pay | Admitting: Internal Medicine

## 2014-10-08 DIAGNOSIS — Z0000 Encounter for general adult medical examination without abnormal findings: Secondary | ICD-10-CM | POA: Insufficient documentation

## 2014-10-08 NOTE — Assessment & Plan Note (Signed)
Diet and exercise.   

## 2014-10-08 NOTE — Assessment & Plan Note (Signed)
Colonoscopy 02/20/14 - diverticulosis and internal hemorrhoids.  F/u colonoscopy recommended in 2020.

## 2014-10-08 NOTE — Assessment & Plan Note (Signed)
Is s/p ablation.  Doing better.  Continues f/u with cardiology.  On xarelto.

## 2014-10-08 NOTE — Assessment & Plan Note (Signed)
Had abdominal ultrasound that revealed changes c/w fatty liver.  Check liver panel today.  Has lost weight.

## 2014-10-08 NOTE — Assessment & Plan Note (Signed)
Has lost weight.  Low cholesterol diet and exercise.  Follow lipid panel.  She has eaten today.  Comes from out of town.  Will go ahead and draw non fasting labs today.  She will have her cardiologist check cholesterol.

## 2014-10-08 NOTE — Assessment & Plan Note (Signed)
Physical today 10/06/14.  Mammogram 05/02/14 - Birads I.  Colonoscopy 02/20/14 - diverticulosis and internal hemorrhoids.  Recommended f/u colonoscopy in five years.

## 2014-10-08 NOTE — Assessment & Plan Note (Signed)
Does not feel she needs any further intervention at this time.  Wants to change to daily prozac - secondary to cost.  Follow.

## 2014-10-28 ENCOUNTER — Other Ambulatory Visit: Payer: Self-pay | Admitting: Internal Medicine

## 2015-02-06 ENCOUNTER — Encounter: Payer: Self-pay | Admitting: Internal Medicine

## 2015-02-06 ENCOUNTER — Ambulatory Visit (INDEPENDENT_AMBULATORY_CARE_PROVIDER_SITE_OTHER): Payer: PRIVATE HEALTH INSURANCE | Admitting: Internal Medicine

## 2015-02-06 VITALS — BP 120/70 | HR 53 | Temp 98.4°F | Resp 18 | Ht 67.25 in | Wt 212.0 lb

## 2015-02-06 DIAGNOSIS — E669 Obesity, unspecified: Secondary | ICD-10-CM

## 2015-02-06 DIAGNOSIS — F329 Major depressive disorder, single episode, unspecified: Secondary | ICD-10-CM

## 2015-02-06 DIAGNOSIS — I4891 Unspecified atrial fibrillation: Secondary | ICD-10-CM | POA: Diagnosis not present

## 2015-02-06 DIAGNOSIS — K760 Fatty (change of) liver, not elsewhere classified: Secondary | ICD-10-CM

## 2015-02-06 DIAGNOSIS — M542 Cervicalgia: Secondary | ICD-10-CM

## 2015-02-06 DIAGNOSIS — E7800 Pure hypercholesterolemia, unspecified: Secondary | ICD-10-CM

## 2015-02-06 DIAGNOSIS — F32A Depression, unspecified: Secondary | ICD-10-CM

## 2015-02-06 LAB — LIPID PANEL
Cholesterol: 201 mg/dL — ABNORMAL HIGH (ref 0–200)
HDL: 53.1 mg/dL (ref >39.00)
LDL Cholesterol: 116 mg/dL — ABNORMAL HIGH (ref 0–99)
NonHDL: 147.6
Total CHOL/HDL Ratio: 4
Triglycerides: 156 mg/dL — ABNORMAL HIGH (ref 0.0–149.0)
VLDL: 31.2 mg/dL (ref 0.0–40.0)

## 2015-02-06 LAB — HEPATIC FUNCTION PANEL
ALT: 17 U/L (ref 0–35)
AST: 18 U/L (ref 0–37)
Albumin: 4 g/dL (ref 3.5–5.2)
Alkaline Phosphatase: 91 U/L (ref 39–117)
Bilirubin, Direct: 0.1 mg/dL (ref 0.0–0.3)
Total Bilirubin: 0.4 mg/dL (ref 0.2–1.2)
Total Protein: 7.1 g/dL (ref 6.0–8.3)

## 2015-02-06 LAB — BASIC METABOLIC PANEL
BUN: 10 mg/dL (ref 6–23)
CO2: 29 mEq/L (ref 19–32)
Calcium: 9.3 mg/dL (ref 8.4–10.5)
Chloride: 103 mEq/L (ref 96–112)
Creatinine, Ser: 0.62 mg/dL (ref 0.40–1.20)
GFR: 103.84 mL/min (ref >60.00)
Glucose, Bld: 92 mg/dL (ref 70–99)
Potassium: 4.4 mEq/L (ref 3.5–5.1)
Sodium: 137 mEq/L (ref 135–145)

## 2015-02-06 MED ORDER — FLUOXETINE HCL 20 MG PO TABS: 20.0000 mg | ORAL_TABLET | Freq: Every day | ORAL | Status: DC

## 2015-02-06 NOTE — Progress Notes (Signed)
Pre-visit discussion using our clinic review tool. No additional management support is needed unless otherwise documented below in the visit note.  
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
## 2015-02-11 ENCOUNTER — Encounter: Payer: Self-pay | Admitting: Internal Medicine

## 2015-02-11 DIAGNOSIS — M542 Cervicalgia: Secondary | ICD-10-CM | POA: Insufficient documentation

## 2015-02-11 NOTE — Assessment & Plan Note (Signed)
Low cholesterol diet and exercise.  Follow lipid panel and liver function tests.  

## 2015-02-11 NOTE — Assessment & Plan Note (Signed)
Neck pain with left arm pain.  Certain positions worsen.  Pt wants to see Dr Yetta BarreJones.  He did her previous surgery.  Order placed for referral.

## 2015-02-11 NOTE — Assessment & Plan Note (Signed)
Increased stress and some mild depressive symptoms.  Increase prozac to 20mg  q day.  Follow.  Get her back in soon to reassess.

## 2015-02-11 NOTE — Assessment & Plan Note (Signed)
Is s/p ablation.  Still with episodes of intermittent increased heart rate and palpitations.  Followed by cardiology.  On xarelto.

## 2015-02-11 NOTE — Assessment & Plan Note (Signed)
Diet and exercise.  Follow.  

## 2015-02-11 NOTE — Assessment & Plan Note (Signed)
Had abdominal ultrasound that revealed changes c/w fatty liver.  Follow liver panel.   

## 2015-04-09 ENCOUNTER — Ambulatory Visit (INDEPENDENT_AMBULATORY_CARE_PROVIDER_SITE_OTHER): Payer: BLUE CROSS/BLUE SHIELD | Admitting: Internal Medicine

## 2015-04-09 ENCOUNTER — Encounter: Payer: Self-pay | Admitting: Internal Medicine

## 2015-04-09 VITALS — BP 120/70 | HR 53 | Temp 98.6°F | Resp 17 | Ht 67.25 in | Wt 213.1 lb

## 2015-04-09 DIAGNOSIS — F329 Major depressive disorder, single episode, unspecified: Secondary | ICD-10-CM

## 2015-04-09 DIAGNOSIS — E669 Obesity, unspecified: Secondary | ICD-10-CM

## 2015-04-09 DIAGNOSIS — I4891 Unspecified atrial fibrillation: Secondary | ICD-10-CM | POA: Diagnosis not present

## 2015-04-09 DIAGNOSIS — F32A Depression, unspecified: Secondary | ICD-10-CM

## 2015-04-09 DIAGNOSIS — K5730 Diverticulosis of large intestine without perforation or abscess without bleeding: Secondary | ICD-10-CM

## 2015-04-09 DIAGNOSIS — Z1239 Encounter for other screening for malignant neoplasm of breast: Secondary | ICD-10-CM

## 2015-04-09 DIAGNOSIS — R112 Nausea with vomiting, unspecified: Secondary | ICD-10-CM

## 2015-04-09 DIAGNOSIS — K760 Fatty (change of) liver, not elsewhere classified: Secondary | ICD-10-CM | POA: Diagnosis not present

## 2015-04-09 DIAGNOSIS — E7800 Pure hypercholesterolemia, unspecified: Secondary | ICD-10-CM

## 2015-04-09 NOTE — Progress Notes (Signed)
Pre-visit discussion using our clinic review tool. No additional management support is needed unless otherwise documented below in the visit note.  
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
## 2015-04-16 ENCOUNTER — Encounter: Payer: Self-pay | Admitting: Internal Medicine

## 2015-04-16 NOTE — Assessment & Plan Note (Signed)
Had abdominal ultrasound that revealed changes c/w fatty liver.  Follow liver panel.   Liver panel 02/06/15 - wnl.

## 2015-04-16 NOTE — Assessment & Plan Note (Signed)
Colonoscopy 02/20/14 - diverticulosis and internal hemorrhoids.  F/u with colonoscopy in 2020.

## 2015-04-16 NOTE — Assessment & Plan Note (Signed)
Symptoms improved on protonix.  She is having no abdominal pain, nausea or vomiting now.  Follow.

## 2015-04-16 NOTE — Assessment & Plan Note (Signed)
Still having issues with afib.  On sotalol and xarelto.  Sees cardiology.  Desires no further intervention at this time.  Follow.

## 2015-04-16 NOTE — Assessment & Plan Note (Signed)
Doing better on increased prozac.  Follow.

## 2015-04-16 NOTE — Assessment & Plan Note (Signed)
Low cholesterol diet and exercise.  Follow lipid panel.   

## 2015-04-16 NOTE — Assessment & Plan Note (Signed)
Diet and exercise.   

## 2015-05-03 ENCOUNTER — Ambulatory Visit: Payer: BLUE CROSS/BLUE SHIELD

## 2015-05-17 ENCOUNTER — Ambulatory Visit: Payer: BLUE CROSS/BLUE SHIELD

## 2015-06-05 ENCOUNTER — Other Ambulatory Visit: Payer: Self-pay | Admitting: Internal Medicine

## 2015-06-05 NOTE — Telephone Encounter (Signed)
OK to refill Prozac last OV 2/17?

## 2015-06-06 NOTE — Telephone Encounter (Signed)
ok'd refill for prozac #30 with 3 refills.   

## 2015-07-09 ENCOUNTER — Ambulatory Visit: Payer: BLUE CROSS/BLUE SHIELD | Admitting: Internal Medicine

## 2015-09-20 DIAGNOSIS — Z9889 Other specified postprocedural states: Secondary | ICD-10-CM

## 2015-09-20 HISTORY — DX: Other specified postprocedural states: Z98.890

## 2015-10-01 ENCOUNTER — Other Ambulatory Visit: Payer: Self-pay | Admitting: Internal Medicine

## 2015-10-01 NOTE — Telephone Encounter (Signed)
Received refill request electronically Last office visit 04/09/15, was to follow-up in 3 months Appointment cancelled, no upcoming appointment scheduled Okay to refill?

## 2015-10-02 NOTE — Telephone Encounter (Signed)
Needs appt scheduled.  Schedule appt and ok to reifll until appt.  Thanks

## 2015-11-03 ENCOUNTER — Other Ambulatory Visit: Payer: Self-pay | Admitting: Internal Medicine

## 2015-11-06 NOTE — Telephone Encounter (Signed)
Pt was advised on last refill to schedule OV. No OV on file. Advise on refill.

## 2015-11-07 NOTE — Telephone Encounter (Signed)
Need to schedule office visit.  Once scheduled ok to refill until visit.

## 2015-11-08 NOTE — Telephone Encounter (Signed)
Please schedule appmt for patient 

## 2015-11-08 NOTE — Telephone Encounter (Signed)
Please advise 

## 2015-12-08 ENCOUNTER — Other Ambulatory Visit: Payer: Self-pay | Admitting: Internal Medicine

## 2016-01-06 ENCOUNTER — Other Ambulatory Visit: Payer: Self-pay | Admitting: Internal Medicine

## 2016-01-10 ENCOUNTER — Encounter: Payer: Self-pay | Admitting: Internal Medicine

## 2016-01-10 ENCOUNTER — Ambulatory Visit (INDEPENDENT_AMBULATORY_CARE_PROVIDER_SITE_OTHER): Payer: BLUE CROSS/BLUE SHIELD | Admitting: Internal Medicine

## 2016-01-10 VITALS — BP 122/80 | HR 55 | Temp 98.0°F | Ht 67.0 in | Wt 210.2 lb

## 2016-01-10 DIAGNOSIS — R079 Chest pain, unspecified: Secondary | ICD-10-CM | POA: Diagnosis not present

## 2016-01-10 DIAGNOSIS — R112 Nausea with vomiting, unspecified: Secondary | ICD-10-CM

## 2016-01-10 DIAGNOSIS — I4891 Unspecified atrial fibrillation: Secondary | ICD-10-CM

## 2016-01-10 DIAGNOSIS — K760 Fatty (change of) liver, not elsewhere classified: Secondary | ICD-10-CM

## 2016-01-10 DIAGNOSIS — E7800 Pure hypercholesterolemia, unspecified: Secondary | ICD-10-CM | POA: Diagnosis not present

## 2016-01-10 DIAGNOSIS — E669 Obesity, unspecified: Secondary | ICD-10-CM

## 2016-01-10 LAB — CBC WITH DIFFERENTIAL/PLATELET
Basophils Absolute: 0.1 10*3/uL (ref 0.0–0.1)
Basophils Relative: 1.3 % (ref 0.0–3.0)
Eosinophils Absolute: 0.1 10*3/uL (ref 0.0–0.7)
Eosinophils Relative: 1.9 % (ref 0.0–5.0)
HCT: 40.8 % (ref 36.0–46.0)
Hemoglobin: 13.6 g/dL (ref 12.0–15.0)
Lymphocytes Relative: 28.1 % (ref 12.0–46.0)
Lymphs Abs: 1.9 10*3/uL (ref 0.7–4.0)
MCHC: 33.4 g/dL (ref 30.0–36.0)
MCV: 85.2 fl (ref 78.0–100.0)
Monocytes Absolute: 0.6 10*3/uL (ref 0.1–1.0)
Monocytes Relative: 9.7 % (ref 3.0–12.0)
Neutro Abs: 4 10*3/uL (ref 1.4–7.7)
Neutrophils Relative %: 59 % (ref 43.0–77.0)
Platelets: 268 10*3/uL (ref 150.0–400.0)
RBC: 4.78 Mil/uL (ref 3.87–5.11)
RDW: 14.3 % (ref 11.5–15.5)
WBC: 6.7 10*3/uL (ref 4.0–10.5)

## 2016-01-10 LAB — HEPATIC FUNCTION PANEL
ALT: 12 U/L (ref 0–35)
AST: 17 U/L (ref 0–37)
Albumin: 4.2 g/dL (ref 3.5–5.2)
Alkaline Phosphatase: 88 U/L (ref 39–117)
Bilirubin, Direct: 0 mg/dL (ref 0.0–0.3)
Total Bilirubin: 0.4 mg/dL (ref 0.2–1.2)
Total Protein: 7.3 g/dL (ref 6.0–8.3)

## 2016-01-10 LAB — LIPID PANEL
Cholesterol: 222 mg/dL — ABNORMAL HIGH (ref 0–200)
HDL: 61.7 mg/dL (ref >39.00)
LDL Cholesterol: 126 mg/dL — ABNORMAL HIGH (ref 0–99)
NonHDL: 160.6
Total CHOL/HDL Ratio: 4
Triglycerides: 172 mg/dL — ABNORMAL HIGH (ref 0.0–149.0)
VLDL: 34.4 mg/dL (ref 0.0–40.0)

## 2016-01-10 LAB — BASIC METABOLIC PANEL
BUN: 11 mg/dL (ref 6–23)
CO2: 28 mEq/L (ref 19–32)
Calcium: 9.4 mg/dL (ref 8.4–10.5)
Chloride: 104 mEq/L (ref 96–112)
Creatinine, Ser: 0.59 mg/dL (ref 0.40–1.20)
GFR: 109.62 mL/min (ref >60.00)
Glucose, Bld: 93 mg/dL (ref 70–99)
Potassium: 4.4 mEq/L (ref 3.5–5.1)
Sodium: 136 mEq/L (ref 135–145)

## 2016-01-10 LAB — TSH: TSH: 1.39 u[IU]/mL (ref 0.35–4.50)
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
## 2016-01-10 NOTE — Progress Notes (Signed)
Pre visit review using our clinic review tool, if applicable. No additional management support is needed unless otherwise documented below in the visit note. 

## 2016-01-20 ENCOUNTER — Encounter: Payer: Self-pay | Admitting: Internal Medicine

## 2016-01-20 NOTE — Assessment & Plan Note (Signed)
Diet and exercise.  Follow.  

## 2016-01-20 NOTE — Assessment & Plan Note (Signed)
Doing better overall.  On protonix.  Controlling acid reflux.  Follow.

## 2016-01-20 NOTE — Assessment & Plan Note (Signed)
Had abdominal ultrasound that revealed changes c/w fatty liver.  Follow liver panel.

## 2016-01-20 NOTE — Assessment & Plan Note (Signed)
Low cholesterol diet and exercise.  Follow lipid panel.   

## 2016-01-20 NOTE — Assessment & Plan Note (Signed)
Overall improved.  On xarelto.  Sees cardilogy.  On sotalol.  Follow.

## 2016-01-20 NOTE — Assessment & Plan Note (Signed)
Having the intermittent chest pain as outlined.  EKG SR with ventricular rate 50.  No acute ischemic changes.  Discussed my concern regarding her intermittent chest pain.  Discussed wanting to refer her to cardiology.  She declines.  Wants to monitor.  Continue risk factor modification.  Follow.  Notify me if changes mind.

## 2016-02-05 ENCOUNTER — Other Ambulatory Visit: Payer: Self-pay | Admitting: Internal Medicine

## 2016-03-05 ENCOUNTER — Other Ambulatory Visit: Payer: Self-pay | Admitting: Internal Medicine

## 2016-07-08 ENCOUNTER — Other Ambulatory Visit: Payer: Self-pay | Admitting: Internal Medicine

## 2016-07-08 NOTE — Telephone Encounter (Signed)
rx ok'd for prozac #30 with no refills until see if keeps appt tomorrow.

## 2016-07-08 NOTE — Telephone Encounter (Signed)
Was refilled in January and needed appt, has one with you tomorrow, please advise. thanks

## 2016-07-09 ENCOUNTER — Encounter: Payer: Self-pay | Admitting: Internal Medicine

## 2016-07-09 ENCOUNTER — Ambulatory Visit (INDEPENDENT_AMBULATORY_CARE_PROVIDER_SITE_OTHER): Payer: No Typology Code available for payment source | Admitting: Internal Medicine

## 2016-07-09 ENCOUNTER — Other Ambulatory Visit (HOSPITAL_COMMUNITY)
Admission: RE | Admit: 2016-07-09 | Discharge: 2016-07-09 | Disposition: A | Discharge status: Home / Self Care | Payer: No Typology Code available for payment source | Source: Ambulatory Visit | Attending: Internal Medicine | Admitting: Internal Medicine

## 2016-07-09 VITALS — BP 108/60 | HR 52 | Temp 98.3°F | Ht 67.5 in | Wt 213.4 lb

## 2016-07-09 DIAGNOSIS — I4891 Unspecified atrial fibrillation: Secondary | ICD-10-CM | POA: Diagnosis not present

## 2016-07-09 DIAGNOSIS — Z1231 Encounter for screening mammogram for malignant neoplasm of breast: Secondary | ICD-10-CM

## 2016-07-09 DIAGNOSIS — E7800 Pure hypercholesterolemia, unspecified: Secondary | ICD-10-CM

## 2016-07-09 DIAGNOSIS — E669 Obesity, unspecified: Secondary | ICD-10-CM

## 2016-07-09 DIAGNOSIS — Z124 Encounter for screening for malignant neoplasm of cervix: Secondary | ICD-10-CM

## 2016-07-09 DIAGNOSIS — Z0000 Encounter for general adult medical examination without abnormal findings: Secondary | ICD-10-CM | POA: Diagnosis not present

## 2016-07-09 DIAGNOSIS — F32A Depression, unspecified: Secondary | ICD-10-CM

## 2016-07-09 DIAGNOSIS — K760 Fatty (change of) liver, not elsewhere classified: Secondary | ICD-10-CM

## 2016-07-09 DIAGNOSIS — F329 Major depressive disorder, single episode, unspecified: Secondary | ICD-10-CM

## 2016-07-09 DIAGNOSIS — Z1239 Encounter for other screening for malignant neoplasm of breast: Secondary | ICD-10-CM

## 2016-07-09 LAB — LIPID PANEL
Cholesterol: 251 mg/dL — ABNORMAL HIGH (ref 0–200)
HDL: 64.5 mg/dL (ref >39.00)
LDL Cholesterol: 154 mg/dL — ABNORMAL HIGH (ref 0–99)
NonHDL: 186.31
Total CHOL/HDL Ratio: 4
Triglycerides: 163 mg/dL — ABNORMAL HIGH (ref 0.0–149.0)
VLDL: 32.6 mg/dL (ref 0.0–40.0)

## 2016-07-09 LAB — BASIC METABOLIC PANEL
BUN: 13 mg/dL (ref 6–23)
CO2: 27 mEq/L (ref 19–32)
Calcium: 9.4 mg/dL (ref 8.4–10.5)
Chloride: 102 mEq/L (ref 96–112)
Creatinine, Ser: 0.64 mg/dL (ref 0.40–1.20)
GFR: 99.64 mL/min (ref >60.00)
Glucose, Bld: 95 mg/dL (ref 70–99)
Potassium: 4.2 mEq/L (ref 3.5–5.1)
Sodium: 134 mEq/L — ABNORMAL LOW (ref 135–145)

## 2016-07-09 LAB — HEPATIC FUNCTION PANEL
ALT: 15 U/L (ref 0–35)
AST: 19 U/L (ref 0–37)
Albumin: 4.4 g/dL (ref 3.5–5.2)
Alkaline Phosphatase: 82 U/L (ref 39–117)
Bilirubin, Direct: 0.1 mg/dL (ref 0.0–0.3)
Total Bilirubin: 0.4 mg/dL (ref 0.2–1.2)
Total Protein: 7.4 g/dL (ref 6.0–8.3)
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
## 2016-07-11 LAB — CYTOLOGY - PAP
Diagnosis: NEGATIVE
HPV: NOT DETECTED

## 2016-07-14 ENCOUNTER — Telehealth: Payer: Self-pay | Admitting: *Deleted

## 2016-07-14 ENCOUNTER — Other Ambulatory Visit: Payer: Self-pay | Admitting: Internal Medicine

## 2016-07-14 DIAGNOSIS — E871 Hypo-osmolality and hyponatremia: Secondary | ICD-10-CM

## 2016-07-14 NOTE — Telephone Encounter (Signed)
See lab results pt given all information.

## 2016-07-14 NOTE — Progress Notes (Signed)
Order placed for f/u sodium.  ?

## 2016-07-14 NOTE — Telephone Encounter (Signed)
Patient requested lab results, pt stated that she is active with mychart .  Pt contact [phone]

## 2016-07-21 ENCOUNTER — Encounter: Payer: Self-pay | Admitting: Internal Medicine

## 2016-07-21 NOTE — Assessment & Plan Note (Signed)
Diet and exercise.  Follow.  

## 2016-07-21 NOTE — Assessment & Plan Note (Signed)
Doing well on prozac.  Follow.  Stable.

## 2016-07-21 NOTE — Assessment & Plan Note (Signed)
Low cholesterol diet and exercise.  Follow lipid panel.   

## 2016-07-21 NOTE — Assessment & Plan Note (Signed)
On coumadin now.  Followed by cardiology.  Stable.  Continue current medication regimen.

## 2016-07-21 NOTE — Assessment & Plan Note (Signed)
Physical today 07/09/16.  Overdue mammogram.  Schedule.  Colonoscopy 02/20/14 - diverticulosis and internal hemorrhoids.  Recommended f/u colonoscopy in 5 years.

## 2016-07-21 NOTE — Assessment & Plan Note (Signed)
Had previous abdominal ultrasound that revealed changes c/w fatty liver.  Recheck liver panel.  Diet, exercise and weight loss.

## 2016-07-25 ENCOUNTER — Other Ambulatory Visit (INDEPENDENT_AMBULATORY_CARE_PROVIDER_SITE_OTHER): Payer: No Typology Code available for payment source

## 2016-07-25 ENCOUNTER — Ambulatory Visit
Admission: RE | Admit: 2016-07-25 | Discharge: 2016-07-25 | Disposition: A | Discharge status: Home / Self Care | Payer: No Typology Code available for payment source | Source: Ambulatory Visit | Attending: Internal Medicine | Admitting: Internal Medicine

## 2016-07-25 DIAGNOSIS — Z1231 Encounter for screening mammogram for malignant neoplasm of breast: Secondary | ICD-10-CM | POA: Diagnosis present

## 2016-07-25 DIAGNOSIS — E871 Hypo-osmolality and hyponatremia: Secondary | ICD-10-CM | POA: Diagnosis not present

## 2016-07-25 DIAGNOSIS — Z1239 Encounter for other screening for malignant neoplasm of breast: Secondary | ICD-10-CM

## 2016-07-25 LAB — SODIUM: Sodium: 134 mEq/L — ABNORMAL LOW (ref 135–145)

## 2016-07-26 ENCOUNTER — Other Ambulatory Visit: Payer: Self-pay | Admitting: Internal Medicine

## 2016-07-26 DIAGNOSIS — E871 Hypo-osmolality and hyponatremia: Secondary | ICD-10-CM

## 2016-07-26 NOTE — Progress Notes (Signed)
Order placed for f/u sodium.  ?

## 2016-08-07 ENCOUNTER — Other Ambulatory Visit: Payer: Self-pay | Admitting: Internal Medicine

## 2016-09-04 ENCOUNTER — Other Ambulatory Visit: Payer: Self-pay | Admitting: Internal Medicine

## 2016-09-09 ENCOUNTER — Other Ambulatory Visit (INDEPENDENT_AMBULATORY_CARE_PROVIDER_SITE_OTHER): Payer: No Typology Code available for payment source

## 2016-09-09 ENCOUNTER — Encounter: Payer: Self-pay | Admitting: Internal Medicine

## 2016-09-09 DIAGNOSIS — E871 Hypo-osmolality and hyponatremia: Secondary | ICD-10-CM | POA: Diagnosis not present

## 2016-09-09 LAB — SODIUM: Sodium: 135 mEq/L (ref 135–145)

## 2016-10-01 ENCOUNTER — Other Ambulatory Visit: Payer: Self-pay | Admitting: Internal Medicine

## 2016-10-29 ENCOUNTER — Other Ambulatory Visit: Payer: Self-pay | Admitting: Internal Medicine

## 2016-12-03 ENCOUNTER — Other Ambulatory Visit: Payer: Self-pay | Admitting: Internal Medicine

## 2017-01-01 ENCOUNTER — Other Ambulatory Visit: Payer: Self-pay | Admitting: Internal Medicine

## 2017-01-09 ENCOUNTER — Ambulatory Visit: Payer: No Typology Code available for payment source | Admitting: Internal Medicine

## 2017-01-09 ENCOUNTER — Other Ambulatory Visit: Payer: Self-pay

## 2017-01-09 ENCOUNTER — Encounter: Payer: Self-pay | Admitting: Internal Medicine

## 2017-01-09 VITALS — BP 110/64 | HR 50 | Temp 98.5°F | Resp 14 | Ht 68.0 in | Wt 204.6 lb

## 2017-01-09 DIAGNOSIS — Z23 Encounter for immunization: Secondary | ICD-10-CM

## 2017-01-09 DIAGNOSIS — K760 Fatty (change of) liver, not elsewhere classified: Secondary | ICD-10-CM

## 2017-01-09 DIAGNOSIS — I4891 Unspecified atrial fibrillation: Secondary | ICD-10-CM | POA: Diagnosis not present

## 2017-01-09 DIAGNOSIS — E7800 Pure hypercholesterolemia, unspecified: Secondary | ICD-10-CM

## 2017-01-09 DIAGNOSIS — F329 Major depressive disorder, single episode, unspecified: Secondary | ICD-10-CM

## 2017-01-09 DIAGNOSIS — F32A Depression, unspecified: Secondary | ICD-10-CM

## 2017-01-09 DIAGNOSIS — E669 Obesity, unspecified: Secondary | ICD-10-CM

## 2017-01-09 MED ORDER — TETANUS-DIPHTH-ACELL PERTUSSIS 5-2.5-18.5 LF-MCG/0.5 IM SUSP: 0.5000 mL | Freq: Once | INTRAMUSCULAR | 0 refills | Status: AC
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
## 2017-01-12 ENCOUNTER — Encounter: Payer: Self-pay | Admitting: Internal Medicine

## 2017-01-12 NOTE — Assessment & Plan Note (Signed)
She has adjusted her diet as outlined.  Discussed diet and exercise.  Follow.

## 2017-01-12 NOTE — Assessment & Plan Note (Addendum)
Low cholesterol diet and exercise.  Follow lipid panel.  She declined to be scheduled for labs.

## 2017-01-12 NOTE — Assessment & Plan Note (Signed)
Increased stress as outlined.  Discussed with her today.  She has done well on prozac.  Does not feel she needs any further intervention.  Follow.

## 2017-01-12 NOTE — Assessment & Plan Note (Signed)
On coumadin.  Followed by cardiology.  Continue current medication regimen.  Overall appears to be stable.

## 2017-01-12 NOTE — Assessment & Plan Note (Signed)
Had previous abdominal ultrasound that revealed fatty liver.  Discussed diet, exercise and weight loss.  Follow.

## 2017-01-29 ENCOUNTER — Other Ambulatory Visit: Payer: Self-pay | Admitting: Internal Medicine

## 2017-04-01 ENCOUNTER — Other Ambulatory Visit: Payer: Self-pay | Admitting: Internal Medicine

## 2017-04-28 ENCOUNTER — Other Ambulatory Visit: Payer: Self-pay | Admitting: Internal Medicine

## 2017-05-27 ENCOUNTER — Other Ambulatory Visit: Payer: Self-pay | Admitting: Internal Medicine

## 2017-07-09 ENCOUNTER — Ambulatory Visit (INDEPENDENT_AMBULATORY_CARE_PROVIDER_SITE_OTHER): Payer: No Typology Code available for payment source | Admitting: Internal Medicine

## 2017-07-09 ENCOUNTER — Encounter: Payer: Self-pay | Admitting: Internal Medicine

## 2017-07-09 VITALS — BP 118/68 | HR 50 | Temp 97.9°F | Resp 18 | Ht 68.0 in | Wt 208.6 lb

## 2017-07-09 DIAGNOSIS — F329 Major depressive disorder, single episode, unspecified: Secondary | ICD-10-CM

## 2017-07-09 DIAGNOSIS — K760 Fatty (change of) liver, not elsewhere classified: Secondary | ICD-10-CM

## 2017-07-09 DIAGNOSIS — E669 Obesity, unspecified: Secondary | ICD-10-CM

## 2017-07-09 DIAGNOSIS — I4891 Unspecified atrial fibrillation: Secondary | ICD-10-CM

## 2017-07-09 DIAGNOSIS — J329 Chronic sinusitis, unspecified: Secondary | ICD-10-CM

## 2017-07-09 DIAGNOSIS — Z0000 Encounter for general adult medical examination without abnormal findings: Secondary | ICD-10-CM | POA: Diagnosis not present

## 2017-07-09 DIAGNOSIS — E7800 Pure hypercholesterolemia, unspecified: Secondary | ICD-10-CM | POA: Diagnosis not present

## 2017-07-09 DIAGNOSIS — F32A Depression, unspecified: Secondary | ICD-10-CM

## 2017-07-09 LAB — CBC WITH DIFFERENTIAL/PLATELET
Basophils Absolute: 0.1 10*3/uL (ref 0.0–0.1)
Basophils Relative: 1.3 % (ref 0.0–3.0)
Eosinophils Absolute: 0.1 10*3/uL (ref 0.0–0.7)
Eosinophils Relative: 1.8 % (ref 0.0–5.0)
HCT: 40.5 % (ref 36.0–46.0)
Hemoglobin: 13.6 g/dL (ref 12.0–15.0)
Lymphocytes Relative: 24.6 % (ref 12.0–46.0)
Lymphs Abs: 1.6 10*3/uL (ref 0.7–4.0)
MCHC: 33.5 g/dL (ref 30.0–36.0)
MCV: 85.6 fl (ref 78.0–100.0)
Monocytes Absolute: 0.7 10*3/uL (ref 0.1–1.0)
Monocytes Relative: 10.5 % (ref 3.0–12.0)
Neutro Abs: 3.9 10*3/uL (ref 1.4–7.7)
Neutrophils Relative %: 61.8 % (ref 43.0–77.0)
Platelets: 247 10*3/uL (ref 150.0–400.0)
RBC: 4.73 Mil/uL (ref 3.87–5.11)
RDW: 14.7 % (ref 11.5–15.5)
WBC: 6.3 10*3/uL (ref 4.0–10.5)

## 2017-07-09 LAB — BASIC METABOLIC PANEL
BUN: 11 mg/dL (ref 6–23)
CO2: 28 mEq/L (ref 19–32)
Calcium: 9.1 mg/dL (ref 8.4–10.5)
Chloride: 103 mEq/L (ref 96–112)
Creatinine, Ser: 0.58 mg/dL (ref 0.40–1.20)
GFR: 111.27 mL/min (ref >60.00)
Glucose, Bld: 94 mg/dL (ref 70–99)
Potassium: 4.7 mEq/L (ref 3.5–5.1)
Sodium: 136 mEq/L (ref 135–145)

## 2017-07-09 LAB — TSH: TSH: 1.78 u[IU]/mL (ref 0.35–4.50)

## 2017-07-09 LAB — HEPATIC FUNCTION PANEL
ALT: 16 U/L (ref 0–35)
AST: 19 U/L (ref 0–37)
Albumin: 4.1 g/dL (ref 3.5–5.2)
Alkaline Phosphatase: 81 U/L (ref 39–117)
Bilirubin, Direct: 0.1 mg/dL (ref 0.0–0.3)
Total Bilirubin: 0.4 mg/dL (ref 0.2–1.2)
Total Protein: 7.1 g/dL (ref 6.0–8.3)

## 2017-07-09 LAB — LIPID PANEL
Cholesterol: 283 mg/dL — ABNORMAL HIGH (ref 0–200)
HDL: 64.3 mg/dL (ref >39.00)
LDL Cholesterol: 189 mg/dL — ABNORMAL HIGH (ref 0–99)
NonHDL: 218.64
Total CHOL/HDL Ratio: 4
Triglycerides: 150 mg/dL — ABNORMAL HIGH (ref 0.0–149.0)
VLDL: 30 mg/dL (ref 0.0–40.0)

## 2017-07-09 NOTE — Assessment & Plan Note (Signed)
Physical today 07/09/17.  Mammogram 07/25/17 - Birads I.  Colonoscopy 02/20/14 - diverticulosis and internal hemorrhoids.  Recommend f/u colonoscopy in 5 years.
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
## 2017-07-10 ENCOUNTER — Ambulatory Visit: Payer: No Typology Code available for payment source | Admitting: Internal Medicine

## 2017-07-10 ENCOUNTER — Encounter: Payer: Self-pay | Admitting: Internal Medicine

## 2017-07-12 ENCOUNTER — Encounter: Payer: Self-pay | Admitting: Internal Medicine

## 2017-07-12 NOTE — Assessment & Plan Note (Signed)
Low cholesterol diet and exercise.  Follow lipid panel.   

## 2017-07-12 NOTE — Assessment & Plan Note (Signed)
Had previous abdominal ultrasound that revealed fatty liver.  Discussed diet, exercise and weight loss.

## 2017-07-12 NOTE — Assessment & Plan Note (Signed)
Discussed diet and exercise.  Follow.  

## 2017-07-12 NOTE — Assessment & Plan Note (Signed)
Followed by cardiology.  On coumadin.  On sotalol.  Overall stable.  Continue current regimen.

## 2017-07-12 NOTE — Assessment & Plan Note (Signed)
Increased stress as outlined.  Discussed with her today.  She does not feel needs anything more at this time.  Follow.   

## 2017-08-28 ENCOUNTER — Other Ambulatory Visit: Payer: Self-pay | Admitting: Internal Medicine

## 2017-10-15 DIAGNOSIS — I429 Cardiomyopathy, unspecified: Secondary | ICD-10-CM | POA: Insufficient documentation

## 2017-10-15 DIAGNOSIS — R5383 Other fatigue: Secondary | ICD-10-CM | POA: Insufficient documentation

## 2017-10-15 DIAGNOSIS — M47812 Spondylosis without myelopathy or radiculopathy, cervical region: Secondary | ICD-10-CM | POA: Insufficient documentation

## 2017-10-15 DIAGNOSIS — I484 Atypical atrial flutter: Secondary | ICD-10-CM | POA: Insufficient documentation

## 2017-10-15 DIAGNOSIS — R000 Tachycardia, unspecified: Secondary | ICD-10-CM | POA: Insufficient documentation

## 2017-11-09 ENCOUNTER — Encounter

## 2017-11-09 ENCOUNTER — Ambulatory Visit: Payer: No Typology Code available for payment source | Admitting: Internal Medicine

## 2017-11-09 VITALS — BP 102/62 | HR 62 | Temp 98.5°F | Resp 18 | Wt 208.0 lb

## 2017-11-09 DIAGNOSIS — K760 Fatty (change of) liver, not elsewhere classified: Secondary | ICD-10-CM | POA: Diagnosis not present

## 2017-11-09 DIAGNOSIS — E669 Obesity, unspecified: Secondary | ICD-10-CM

## 2017-11-09 DIAGNOSIS — I4891 Unspecified atrial fibrillation: Secondary | ICD-10-CM | POA: Diagnosis not present

## 2017-11-09 DIAGNOSIS — F329 Major depressive disorder, single episode, unspecified: Secondary | ICD-10-CM | POA: Diagnosis not present

## 2017-11-09 DIAGNOSIS — E7800 Pure hypercholesterolemia, unspecified: Secondary | ICD-10-CM | POA: Diagnosis not present

## 2017-11-09 DIAGNOSIS — F32A Depression, unspecified: Secondary | ICD-10-CM

## 2017-11-09 LAB — HEPATIC FUNCTION PANEL
ALT: 22 U/L (ref 0–35)
AST: 21 U/L (ref 0–37)
Albumin: 4.1 g/dL (ref 3.5–5.2)
Alkaline Phosphatase: 83 U/L (ref 39–117)
Bilirubin, Direct: 0.1 mg/dL (ref 0.0–0.3)
Total Bilirubin: 0.5 mg/dL (ref 0.2–1.2)
Total Protein: 7.1 g/dL (ref 6.0–8.3)

## 2017-11-09 LAB — CBC WITH DIFFERENTIAL/PLATELET
Basophils Absolute: 0.1 10*3/uL (ref 0.0–0.1)
Basophils Relative: 1.2 % (ref 0.0–3.0)
Eosinophils Absolute: 0.1 10*3/uL (ref 0.0–0.7)
Eosinophils Relative: 1.2 % (ref 0.0–5.0)
HCT: 40.3 % (ref 36.0–46.0)
Hemoglobin: 13.5 g/dL (ref 12.0–15.0)
Lymphocytes Relative: 17.5 % (ref 12.0–46.0)
Lymphs Abs: 1.4 10*3/uL (ref 0.7–4.0)
MCHC: 33.5 g/dL (ref 30.0–36.0)
MCV: 84.7 fl (ref 78.0–100.0)
Monocytes Absolute: 0.7 10*3/uL (ref 0.1–1.0)
Monocytes Relative: 7.9 % (ref 3.0–12.0)
Neutro Abs: 5.9 10*3/uL (ref 1.4–7.7)
Neutrophils Relative %: 72.2 % (ref 43.0–77.0)
Platelets: 294 10*3/uL (ref 150.0–400.0)
RBC: 4.75 Mil/uL (ref 3.87–5.11)
RDW: 14.9 % (ref 11.5–15.5)
WBC: 8.2 10*3/uL (ref 4.0–10.5)

## 2017-11-09 LAB — BASIC METABOLIC PANEL
BUN: 11 mg/dL (ref 6–23)
CO2: 27 mEq/L (ref 19–32)
Calcium: 9.2 mg/dL (ref 8.4–10.5)
Chloride: 97 mEq/L (ref 96–112)
Creatinine, Ser: 0.64 mg/dL (ref 0.40–1.20)
GFR: 99.22 mL/min (ref >60.00)
Glucose, Bld: 101 mg/dL — ABNORMAL HIGH (ref 70–99)
Potassium: 4.6 mEq/L (ref 3.5–5.1)
Sodium: 131 mEq/L — ABNORMAL LOW (ref 135–145)

## 2017-11-09 LAB — VITAMIN B12: Vitamin B-12: 302 pg/mL (ref 211–911)

## 2017-11-09 LAB — LIPID PANEL
Cholesterol: 168 mg/dL (ref 0–200)
HDL: 83.7 mg/dL (ref >39.00)
LDL Cholesterol: 63 mg/dL (ref 0–99)
NonHDL: 84.03
Total CHOL/HDL Ratio: 2
Triglycerides: 104 mg/dL (ref 0.0–149.0)
VLDL: 20.8 mg/dL (ref 0.0–40.0)

## 2017-11-09 LAB — TSH: TSH: 1.91 u[IU]/mL (ref 0.35–4.50)
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
## 2017-11-12 ENCOUNTER — Other Ambulatory Visit: Payer: Self-pay | Admitting: Internal Medicine

## 2017-11-12 ENCOUNTER — Ambulatory Visit (INDEPENDENT_AMBULATORY_CARE_PROVIDER_SITE_OTHER): Payer: No Typology Code available for payment source | Admitting: *Deleted

## 2017-11-12 DIAGNOSIS — E871 Hypo-osmolality and hyponatremia: Secondary | ICD-10-CM

## 2017-11-12 DIAGNOSIS — E538 Deficiency of other specified B group vitamins: Secondary | ICD-10-CM | POA: Diagnosis not present

## 2017-11-12 MED ORDER — CYANOCOBALAMIN 1000 MCG/ML IJ SOLN
1000.0000 ug | Freq: Once | INTRAMUSCULAR | Status: AC
  Administered 2017-11-12: 1000 ug via INTRAMUSCULAR

## 2017-11-12 NOTE — Progress Notes (Signed)
Patient presented for B 12 injection to left deltoid, patient voiced no concerns nor showed any signs of distress during injection. 

## 2017-11-12 NOTE — Progress Notes (Signed)
Order placed for f/u sodium.  ?

## 2017-11-14 ENCOUNTER — Encounter: Payer: Self-pay | Admitting: Internal Medicine

## 2017-11-14 NOTE — Assessment & Plan Note (Signed)
Low cholesterol diet and exercise.  Follow lipid panel.   

## 2017-11-14 NOTE — Assessment & Plan Note (Signed)
Had previous abdominal ultrasound that revealed fatty liver.  Diet, exercise and weight loss.  Follow liver function tests.   

## 2017-11-14 NOTE — Assessment & Plan Note (Signed)
On coumadin.  Recently started on amiodarone.  Taking metoprolol.  Rate controlled.  Doing better.  Has f/u planned with cardiology.

## 2017-11-14 NOTE — Assessment & Plan Note (Signed)
Discussed diet and exercise.  Follow.  

## 2017-11-14 NOTE — Assessment & Plan Note (Signed)
Doing well on current regimen.  Follow.   

## 2017-11-19 ENCOUNTER — Ambulatory Visit (INDEPENDENT_AMBULATORY_CARE_PROVIDER_SITE_OTHER): Payer: No Typology Code available for payment source

## 2017-11-19 ENCOUNTER — Other Ambulatory Visit (INDEPENDENT_AMBULATORY_CARE_PROVIDER_SITE_OTHER): Payer: No Typology Code available for payment source

## 2017-11-19 DIAGNOSIS — E538 Deficiency of other specified B group vitamins: Secondary | ICD-10-CM | POA: Diagnosis not present

## 2017-11-19 DIAGNOSIS — E871 Hypo-osmolality and hyponatremia: Secondary | ICD-10-CM | POA: Diagnosis not present

## 2017-11-19 LAB — SODIUM: Sodium: 132 mEq/L — ABNORMAL LOW (ref 135–145)

## 2017-11-19 MED ORDER — CYANOCOBALAMIN 1000 MCG/ML IJ SOLN
1000.0000 ug | Freq: Once | INTRAMUSCULAR | Status: AC
  Administered 2017-11-19: 1000 ug via INTRAMUSCULAR

## 2017-11-19 NOTE — Progress Notes (Addendum)
Patient comes in for B 12 injection.  Injected right deltoid.  Patient tolerated injection well.   Reviewed.  Dr Scott 

## 2017-11-20 ENCOUNTER — Other Ambulatory Visit: Payer: Self-pay | Admitting: Internal Medicine

## 2017-11-20 DIAGNOSIS — E871 Hypo-osmolality and hyponatremia: Secondary | ICD-10-CM

## 2017-11-20 NOTE — Progress Notes (Signed)
Order placed for f/u sodium.  ?

## 2017-11-26 ENCOUNTER — Ambulatory Visit (INDEPENDENT_AMBULATORY_CARE_PROVIDER_SITE_OTHER): Payer: No Typology Code available for payment source

## 2017-11-26 DIAGNOSIS — E538 Deficiency of other specified B group vitamins: Secondary | ICD-10-CM | POA: Diagnosis not present

## 2017-11-26 MED ORDER — CYANOCOBALAMIN 1000 MCG/ML IJ SOLN
1000.0000 ug | Freq: Once | INTRAMUSCULAR | Status: AC
  Administered 2017-11-26: 1000 ug via INTRAMUSCULAR

## 2017-11-26 NOTE — Progress Notes (Addendum)
Patient comes in for B 12 injection.  Injected left deltoid.  Patient tolerated injection well.   Reviewed.  Dr Scott 

## 2017-12-03 ENCOUNTER — Ambulatory Visit (INDEPENDENT_AMBULATORY_CARE_PROVIDER_SITE_OTHER): Payer: No Typology Code available for payment source

## 2017-12-03 ENCOUNTER — Encounter: Payer: Self-pay | Admitting: Internal Medicine

## 2017-12-03 ENCOUNTER — Other Ambulatory Visit (INDEPENDENT_AMBULATORY_CARE_PROVIDER_SITE_OTHER): Payer: No Typology Code available for payment source

## 2017-12-03 DIAGNOSIS — E871 Hypo-osmolality and hyponatremia: Secondary | ICD-10-CM | POA: Diagnosis not present

## 2017-12-03 DIAGNOSIS — E538 Deficiency of other specified B group vitamins: Secondary | ICD-10-CM | POA: Diagnosis not present

## 2017-12-03 LAB — SODIUM: Sodium: 133 meq/L — ABNORMAL LOW (ref 135–145)

## 2017-12-03 MED ORDER — CYANOCOBALAMIN 1000 MCG/ML IJ SOLN
1000.0000 ug | Freq: Once | INTRAMUSCULAR | Status: AC
  Administered 2017-12-03: 1000 ug via INTRAMUSCULAR

## 2017-12-03 NOTE — Progress Notes (Signed)
Pt was here today for a NV to receive her B-12 shot. Shot was given in RD and pt seem to tolerate well.

## 2018-01-06 ENCOUNTER — Ambulatory Visit (INDEPENDENT_AMBULATORY_CARE_PROVIDER_SITE_OTHER): Payer: No Typology Code available for payment source | Admitting: *Deleted

## 2018-01-06 DIAGNOSIS — E538 Deficiency of other specified B group vitamins: Secondary | ICD-10-CM

## 2018-01-06 MED ORDER — CYANOCOBALAMIN 1000 MCG/ML IJ SOLN
1000.0000 ug | Freq: Once | INTRAMUSCULAR | Status: AC
  Administered 2018-01-06: 1000 ug via INTRAMUSCULAR

## 2018-01-06 NOTE — Progress Notes (Signed)
Patient presented for B 12 injection to left deltoid, patient voiced no concerns nor showed any signs of distress during injection. 

## 2018-02-09 ENCOUNTER — Ambulatory Visit (INDEPENDENT_AMBULATORY_CARE_PROVIDER_SITE_OTHER): Payer: No Typology Code available for payment source | Admitting: *Deleted

## 2018-02-09 DIAGNOSIS — E538 Deficiency of other specified B group vitamins: Secondary | ICD-10-CM | POA: Diagnosis not present

## 2018-02-09 MED ORDER — CYANOCOBALAMIN 1000 MCG/ML IJ SOLN
1000.0000 ug | Freq: Once | INTRAMUSCULAR | Status: AC
  Administered 2018-02-09: 1000 ug via INTRAMUSCULAR

## 2018-02-09 NOTE — Progress Notes (Signed)
Patient presented for B 12 injection to right deltoid, patient voiced no concerns nor showed any signs of distress during injection. 

## 2018-03-04 ENCOUNTER — Other Ambulatory Visit: Payer: Self-pay | Admitting: Internal Medicine

## 2018-03-11 ENCOUNTER — Encounter: Payer: Self-pay | Admitting: Internal Medicine

## 2018-03-11 ENCOUNTER — Ambulatory Visit: Payer: No Typology Code available for payment source | Admitting: Internal Medicine

## 2018-03-11 VITALS — BP 104/68 | HR 86 | Temp 97.7°F | Wt 215.6 lb

## 2018-03-11 DIAGNOSIS — F329 Major depressive disorder, single episode, unspecified: Secondary | ICD-10-CM | POA: Diagnosis not present

## 2018-03-11 DIAGNOSIS — F32A Depression, unspecified: Secondary | ICD-10-CM

## 2018-03-11 DIAGNOSIS — K760 Fatty (change of) liver, not elsewhere classified: Secondary | ICD-10-CM

## 2018-03-11 DIAGNOSIS — E871 Hypo-osmolality and hyponatremia: Secondary | ICD-10-CM

## 2018-03-11 DIAGNOSIS — E669 Obesity, unspecified: Secondary | ICD-10-CM

## 2018-03-11 DIAGNOSIS — E7800 Pure hypercholesterolemia, unspecified: Secondary | ICD-10-CM

## 2018-03-11 DIAGNOSIS — I4891 Unspecified atrial fibrillation: Secondary | ICD-10-CM | POA: Diagnosis not present

## 2018-03-11 DIAGNOSIS — E538 Deficiency of other specified B group vitamins: Secondary | ICD-10-CM | POA: Diagnosis not present

## 2018-03-11 LAB — BASIC METABOLIC PANEL
BUN: 14 mg/dL (ref 6–23)
CO2: 28 mEq/L (ref 19–32)
Calcium: 9.3 mg/dL (ref 8.4–10.5)
Chloride: 99 mEq/L (ref 96–112)
Creatinine, Ser: 0.86 mg/dL (ref 0.40–1.20)
GFR: 70.48 mL/min (ref >60.00)
Glucose, Bld: 121 mg/dL — ABNORMAL HIGH (ref 70–99)
Potassium: 4.6 mEq/L (ref 3.5–5.1)
Sodium: 133 mEq/L — ABNORMAL LOW (ref 135–145)

## 2018-03-11 MED ORDER — CYANOCOBALAMIN 1000 MCG/ML IJ SOLN
1000.0000 ug | Freq: Once | INTRAMUSCULAR | Status: AC
  Administered 2018-03-11: 1000 ug via INTRAMUSCULAR
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
## 2018-03-12 ENCOUNTER — Encounter: Payer: Self-pay | Admitting: Internal Medicine

## 2018-03-14 NOTE — Assessment & Plan Note (Signed)
Discussed diet and exercise.  Follow.  

## 2018-03-14 NOTE — Assessment & Plan Note (Signed)
Had previous abdominal ultrasound that revealed fatty liver.  Diet, exercise and weight loss.  Follow liver function tests.

## 2018-03-14 NOTE — Assessment & Plan Note (Signed)
On coumadin.  Cardiology following pt/inr.  Off amiodarone.  On sotalol.  Rate controlled.  Continue f/u with cardiology.  Feels better.

## 2018-03-14 NOTE — Assessment & Plan Note (Signed)
Low cholesterol diet and exercise.  Follow lipid panel.   

## 2018-03-14 NOTE — Assessment & Plan Note (Signed)
Doing well on current regimen.  Follow.   

## 2018-06-04 ENCOUNTER — Other Ambulatory Visit: Payer: Self-pay | Admitting: Internal Medicine

## 2018-08-28 DIAGNOSIS — J019 Acute sinusitis, unspecified: Secondary | ICD-10-CM | POA: Diagnosis not present

## 2018-08-28 DIAGNOSIS — J029 Acute pharyngitis, unspecified: Secondary | ICD-10-CM | POA: Diagnosis not present

## 2018-08-28 DIAGNOSIS — R070 Pain in throat: Secondary | ICD-10-CM | POA: Diagnosis not present

## 2018-08-28 DIAGNOSIS — Z03818 Encounter for observation for suspected exposure to other biological agents ruled out: Secondary | ICD-10-CM | POA: Diagnosis not present

## 2018-09-05 ENCOUNTER — Other Ambulatory Visit: Payer: Self-pay | Admitting: Internal Medicine

## 2018-09-23 ENCOUNTER — Encounter: Payer: No Typology Code available for payment source | Admitting: Internal Medicine

## 2018-11-17 ENCOUNTER — Telehealth: Payer: Self-pay

## 2018-11-17 NOTE — Telephone Encounter (Signed)
It looks like the referral was placed yesterday ?

## 2018-11-17 NOTE — Telephone Encounter (Signed)
Copied from [place] [phone] [phone]. Topic: Referral - Status >> Nov 17, 2018 11:45 AM Alanda Slim E wrote: Reason for CRM: Batiala from [facility] called and stated she only received a fax with demographics and needs the actual [employer] referral for this Pt. The Pt has to reschedule until the referral is received. Pt has rescheduled appt for 9.24.20 and referral is needed before then/ please advise

## 2018-11-24 ENCOUNTER — Telehealth: Payer: Self-pay

## 2018-11-24 NOTE — Telephone Encounter (Signed)
Copied from [place] [phone]. Topic: Referral - Status >> Nov 17, 2018 11:45 AM Alanda Slim E wrote: Reason for CRM: Batiala from [facility] called and stated she only received a fax with demographics and needs the actual [employer] referral for this Pt. The Pt has to reschedule until the referral is received. Pt has rescheduled appt for 9.24.20 and referral is needed before then/ please advise >> Nov 24, 2018 11:17 AM Mathis Bud wrote: Tanya from [facility] stating she did receive a new patient referral but that is not what their office needed, The patient needed a [employer] referral.  Lavella Lemons states she does not want to turn the patient away again. Lavella Lemons is requesting that referral be faxed ASAP. Call back [phone]

## 2018-11-24 NOTE — Telephone Encounter (Signed)
Is this something you can help with?

## 2018-11-25 NOTE — Telephone Encounter (Signed)
Called her insurance back this morning. They have not processed her referral yet. I've advised patient this morning. She was to call [facility] and cancel her appt for today. I told her that I will let her know when her insurance gives her the authorization.

## 2018-11-25 NOTE — Telephone Encounter (Signed)
Thank you :)

## 2018-12-07 ENCOUNTER — Other Ambulatory Visit: Payer: Self-pay | Admitting: Internal Medicine

## 2018-12-16 ENCOUNTER — Other Ambulatory Visit: Payer: Self-pay

## 2018-12-20 ENCOUNTER — Other Ambulatory Visit: Payer: Self-pay

## 2018-12-20 ENCOUNTER — Ambulatory Visit (INDEPENDENT_AMBULATORY_CARE_PROVIDER_SITE_OTHER): Payer: Medicare HMO | Admitting: Internal Medicine

## 2018-12-20 VITALS — BP 124/70 | HR 88 | Temp 97.4°F | Resp 16 | Ht 68.0 in | Wt 197.6 lb

## 2018-12-20 DIAGNOSIS — R0981 Nasal congestion: Secondary | ICD-10-CM

## 2018-12-20 DIAGNOSIS — I4891 Unspecified atrial fibrillation: Secondary | ICD-10-CM | POA: Diagnosis not present

## 2018-12-20 DIAGNOSIS — E7800 Pure hypercholesterolemia, unspecified: Secondary | ICD-10-CM | POA: Diagnosis not present

## 2018-12-20 DIAGNOSIS — F32A Depression, unspecified: Secondary | ICD-10-CM

## 2018-12-20 DIAGNOSIS — M2560 Stiffness of unspecified joint, not elsewhere classified: Secondary | ICD-10-CM

## 2018-12-20 DIAGNOSIS — Z1231 Encounter for screening mammogram for malignant neoplasm of breast: Secondary | ICD-10-CM

## 2018-12-20 DIAGNOSIS — E2839 Other primary ovarian failure: Secondary | ICD-10-CM

## 2018-12-20 DIAGNOSIS — K760 Fatty (change of) liver, not elsewhere classified: Secondary | ICD-10-CM

## 2018-12-20 DIAGNOSIS — Z0000 Encounter for general adult medical examination without abnormal findings: Secondary | ICD-10-CM

## 2018-12-20 DIAGNOSIS — F329 Major depressive disorder, single episode, unspecified: Secondary | ICD-10-CM

## 2018-12-20 DIAGNOSIS — E669 Obesity, unspecified: Secondary | ICD-10-CM

## 2018-12-20 MED ORDER — AZELASTINE HCL 0.1 % NA SOLN: 1.0000 | Freq: Two times a day (BID) | NASAL | 1 refills | Status: DC
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
## 2018-12-21 ENCOUNTER — Ambulatory Visit: Payer: Self-pay

## 2018-12-21 LAB — BASIC METABOLIC PANEL
BUN: 14 mg/dL (ref 6–23)
CO2: 26 mEq/L (ref 19–32)
Calcium: 9.2 mg/dL (ref 8.4–10.5)
Chloride: 101 mEq/L (ref 96–112)
Creatinine, Ser: 0.69 mg/dL (ref 0.40–1.20)
GFR: 85.29 mL/min (ref >60.00)
Glucose, Bld: 78 mg/dL (ref 70–99)
Potassium: 4.6 mEq/L (ref 3.5–5.1)
Sodium: 135 mEq/L (ref 135–145)

## 2018-12-21 LAB — CBC WITH DIFFERENTIAL/PLATELET
Basophils Absolute: 0 10*3/uL (ref 0.0–0.1)
Basophils Relative: 0.6 % (ref 0.0–3.0)
Eosinophils Absolute: 0.1 10*3/uL (ref 0.0–0.7)
Eosinophils Relative: 2.2 % (ref 0.0–5.0)
HCT: 41.8 % (ref 36.0–46.0)
Hemoglobin: 13.9 g/dL (ref 12.0–15.0)
Lymphocytes Relative: 20.9 % (ref 12.0–46.0)
Lymphs Abs: 1.4 10*3/uL (ref 0.7–4.0)
MCHC: 33.1 g/dL (ref 30.0–36.0)
MCV: 88.5 fl (ref 78.0–100.0)
Monocytes Absolute: 0.7 10*3/uL (ref 0.1–1.0)
Monocytes Relative: 10.6 % (ref 3.0–12.0)
Neutro Abs: 4.3 10*3/uL (ref 1.4–7.7)
Neutrophils Relative %: 65.7 % (ref 43.0–77.0)
Platelets: 257 10*3/uL (ref 150.0–400.0)
RBC: 4.72 Mil/uL (ref 3.87–5.11)
RDW: 15.1 % (ref 11.5–15.5)
WBC: 6.5 10*3/uL (ref 4.0–10.5)

## 2018-12-21 LAB — HEPATIC FUNCTION PANEL
ALT: 15 U/L (ref 0–35)
AST: 20 U/L (ref 0–37)
Albumin: 4.2 g/dL (ref 3.5–5.2)
Alkaline Phosphatase: 82 U/L (ref 39–117)
Bilirubin, Direct: 0.1 mg/dL (ref 0.0–0.3)
Total Bilirubin: 0.5 mg/dL (ref 0.2–1.2)
Total Protein: 6.7 g/dL (ref 6.0–8.3)

## 2018-12-21 LAB — LIPID PANEL
Cholesterol: 248 mg/dL — ABNORMAL HIGH (ref 0–200)
HDL: 75.3 mg/dL (ref >39.00)
LDL Cholesterol: 143 mg/dL — ABNORMAL HIGH (ref 0–99)
NonHDL: 172.76
Total CHOL/HDL Ratio: 3
Triglycerides: 149 mg/dL (ref 0.0–149.0)
VLDL: 29.8 mg/dL (ref 0.0–40.0)

## 2018-12-21 LAB — PROTIME-INR
INR: 1.3 — ABNORMAL HIGH (ref 0.9–1.2)
Prothrombin Time: 14 s — ABNORMAL HIGH (ref 9.1–12.0)

## 2018-12-21 LAB — TSH: TSH: 1.11 u[IU]/mL (ref 0.35–4.50)

## 2018-12-21 NOTE — Telephone Encounter (Signed)
Provided labs  Results  Per  Dr.  Einar Pheasant  10/20 /20 Patient states the name of cardiologist is Dr. Laymond Purser [phone]. [place], [place]   . Patient voiced understanding about labs.

## 2018-12-26 ENCOUNTER — Encounter: Payer: Self-pay | Admitting: Internal Medicine

## 2018-12-26 DIAGNOSIS — M2560 Stiffness of unspecified joint, not elsewhere classified: Secondary | ICD-10-CM | POA: Insufficient documentation

## 2018-12-26 DIAGNOSIS — R0981 Nasal congestion: Secondary | ICD-10-CM | POA: Insufficient documentation

## 2018-12-26 NOTE — Assessment & Plan Note (Signed)
Continue saline nasal spray and flonase.  Add astelin nasal spray.  Follow.

## 2018-12-26 NOTE — Assessment & Plan Note (Signed)
On coumadin.  Cardiology following pt/inr.  Will check today since has not had checked recently.  Will forward results to her cardiologist.

## 2018-12-26 NOTE — Assessment & Plan Note (Signed)
Diet and exercise.  Follow.  

## 2018-12-26 NOTE — Assessment & Plan Note (Signed)
Had previously ultrasound that revealed fatty liver.  Diet and exercise.  Follow liver function tests.

## 2018-12-26 NOTE — Assessment & Plan Note (Signed)
Doing well on current regimen.  Follow.   

## 2018-12-26 NOTE — Assessment & Plan Note (Signed)
Physical today 12/20/18.  Mammogram 07/25/17 - I.  Needs f/u mammogram.  colonoscopy 01/2014 - diverticulosis and internal hemorrhoids.  Recommend f/u colonoscopy in 5 years.

## 2018-12-26 NOTE — Assessment & Plan Note (Signed)
Low cholesterol diet and exercise.  Follow lipid panel.   

## 2018-12-26 NOTE — Assessment & Plan Note (Signed)
Joint stiffness as outlined.  Desires not to pursue any further w/up at this time.  Follow.

## 2018-12-28 ENCOUNTER — Encounter: Payer: Self-pay | Admitting: Internal Medicine

## 2019-03-09 ENCOUNTER — Other Ambulatory Visit: Payer: Self-pay | Admitting: Internal Medicine

## 2019-06-10 ENCOUNTER — Other Ambulatory Visit: Payer: Self-pay | Admitting: Internal Medicine

## 2019-09-10 ENCOUNTER — Other Ambulatory Visit: Payer: Self-pay | Admitting: Internal Medicine

## 2019-09-14 ENCOUNTER — Telehealth: Payer: Self-pay | Admitting: Internal Medicine

## 2019-09-14 NOTE — Telephone Encounter (Signed)
NUMBER NOT IN SERVICE. Pt due to schedule Medicare Annual Wellness Visit (AWV) either virtually or audio only.  No hx of AWV; please schedule at anytime with Denisa O'Brien-Blaney at [facility]  OK TO BILL [phone]

## 2019-12-11 ENCOUNTER — Other Ambulatory Visit: Payer: Self-pay | Admitting: Internal Medicine

## 2019-12-22 ENCOUNTER — Encounter: Payer: Self-pay | Admitting: Internal Medicine

## 2019-12-22 ENCOUNTER — Ambulatory Visit (INDEPENDENT_AMBULATORY_CARE_PROVIDER_SITE_OTHER): Payer: Medicare HMO

## 2019-12-22 ENCOUNTER — Other Ambulatory Visit: Payer: Self-pay

## 2019-12-22 ENCOUNTER — Ambulatory Visit (INDEPENDENT_AMBULATORY_CARE_PROVIDER_SITE_OTHER): Payer: Medicare HMO | Admitting: Internal Medicine

## 2019-12-22 VITALS — BP 120/82 | HR 104 | Temp 98.4°F | Ht 68.0 in | Wt 204.0 lb

## 2019-12-22 DIAGNOSIS — R5383 Other fatigue: Secondary | ICD-10-CM

## 2019-12-22 DIAGNOSIS — E7800 Pure hypercholesterolemia, unspecified: Secondary | ICD-10-CM

## 2019-12-22 DIAGNOSIS — R140 Abdominal distension (gaseous): Secondary | ICD-10-CM

## 2019-12-22 DIAGNOSIS — I484 Atypical atrial flutter: Secondary | ICD-10-CM

## 2019-12-22 DIAGNOSIS — Z0001 Encounter for general adult medical examination with abnormal findings: Secondary | ICD-10-CM | POA: Diagnosis not present

## 2019-12-22 DIAGNOSIS — I429 Cardiomyopathy, unspecified: Secondary | ICD-10-CM | POA: Diagnosis not present

## 2019-12-22 DIAGNOSIS — Z0000 Encounter for general adult medical examination without abnormal findings: Secondary | ICD-10-CM

## 2019-12-22 DIAGNOSIS — I4891 Unspecified atrial fibrillation: Secondary | ICD-10-CM | POA: Diagnosis not present

## 2019-12-22 DIAGNOSIS — Z1231 Encounter for screening mammogram for malignant neoplasm of breast: Secondary | ICD-10-CM

## 2019-12-22 DIAGNOSIS — Z1211 Encounter for screening for malignant neoplasm of colon: Secondary | ICD-10-CM | POA: Diagnosis not present

## 2019-12-22 DIAGNOSIS — F32A Depression, unspecified: Secondary | ICD-10-CM

## 2019-12-22 DIAGNOSIS — K760 Fatty (change of) liver, not elsewhere classified: Secondary | ICD-10-CM

## 2019-12-22 LAB — CBC WITH DIFFERENTIAL/PLATELET
Basophils Absolute: 0.1 10*3/uL (ref 0.0–0.1)
Basophils Relative: 1.2 % (ref 0.0–3.0)
Eosinophils Absolute: 0.1 10*3/uL (ref 0.0–0.7)
Eosinophils Relative: 1.4 % (ref 0.0–5.0)
HCT: 41.6 % (ref 36.0–46.0)
Hemoglobin: 13.7 g/dL (ref 12.0–15.0)
Lymphocytes Relative: 23.5 % (ref 12.0–46.0)
Lymphs Abs: 1.6 10*3/uL (ref 0.7–4.0)
MCHC: 32.9 g/dL (ref 30.0–36.0)
MCV: 87.2 fl (ref 78.0–100.0)
Monocytes Absolute: 0.7 10*3/uL (ref 0.1–1.0)
Monocytes Relative: 9.8 % (ref 3.0–12.0)
Neutro Abs: 4.4 10*3/uL (ref 1.4–7.7)
Neutrophils Relative %: 64.1 % (ref 43.0–77.0)
Platelets: 295 10*3/uL (ref 150.0–400.0)
RBC: 4.77 Mil/uL (ref 3.87–5.11)
RDW: 14.6 % (ref 11.5–15.5)
WBC: 6.8 10*3/uL (ref 4.0–10.5)

## 2019-12-22 LAB — LIPID PANEL
Cholesterol: 215 mg/dL — ABNORMAL HIGH (ref 0–200)
HDL: 75.5 mg/dL (ref >39.00)
LDL Cholesterol: 111 mg/dL — ABNORMAL HIGH (ref 0–99)
NonHDL: 139.3
Total CHOL/HDL Ratio: 3
Triglycerides: 140 mg/dL (ref 0.0–149.0)
VLDL: 28 mg/dL (ref 0.0–40.0)

## 2019-12-22 LAB — TSH: TSH: 1.09 u[IU]/mL (ref 0.35–4.50)

## 2019-12-22 LAB — BASIC METABOLIC PANEL
BUN: 12 mg/dL (ref 6–23)
CO2: 28 mEq/L (ref 19–32)
Calcium: 9.6 mg/dL (ref 8.4–10.5)
Chloride: 98 mEq/L (ref 96–112)
Creatinine, Ser: 0.76 mg/dL (ref 0.40–1.20)
GFR: 81.68 mL/min (ref >60.00)
Glucose, Bld: 85 mg/dL (ref 70–99)
Potassium: 4.8 mEq/L (ref 3.5–5.1)
Sodium: 133 mEq/L — ABNORMAL LOW (ref 135–145)

## 2019-12-22 LAB — HEPATIC FUNCTION PANEL
ALT: 12 U/L (ref 0–35)
AST: 17 U/L (ref 0–37)
Albumin: 4.3 g/dL (ref 3.5–5.2)
Alkaline Phosphatase: 97 U/L (ref 39–117)
Bilirubin, Direct: 0.1 mg/dL (ref 0.0–0.3)
Total Bilirubin: 0.5 mg/dL (ref 0.2–1.2)
Total Protein: 6.9 g/dL (ref 6.0–8.3)

## 2019-12-22 NOTE — Assessment & Plan Note (Addendum)
Physical today 12/22/19.  Colonoscopy 01/2014 - diverticulosis and internal hemorrhoids.  Recommended f/u colonoscopy in 5 years.  Overdue mammogram.  Schedule.
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
## 2019-12-23 ENCOUNTER — Encounter: Payer: Self-pay | Admitting: Internal Medicine

## 2019-12-24 ENCOUNTER — Encounter: Payer: Self-pay | Admitting: Internal Medicine

## 2019-12-24 NOTE — Assessment & Plan Note (Signed)
Associated with afib episodes.  Check routine labs.

## 2019-12-24 NOTE — Assessment & Plan Note (Signed)
Previous ultrasound - fatty liver.  Diet, exercise and weight loss.  Follow liver function tests.   

## 2019-12-24 NOTE — Assessment & Plan Note (Signed)
Followed by cardiology - afib/aflutter.  S/p repeat ablation 08/2019.  Persistent paroxysmal afib/aflutter.  On eliquis.  Desires no further intervention at this time.  Follow. 

## 2019-12-24 NOTE — Assessment & Plan Note (Signed)
Persistent abdominal bloating.  Trying to keep bowels moving.  Gets full fast.  Check KUB.  Discussed possible CT scan abdomen/pelvis.

## 2019-12-24 NOTE — Assessment & Plan Note (Addendum)
Followed by cardiology - afib/aflutter.  S/p repeat ablation 08/2019.  Persistent paroxysmal afib/aflutter.  On eliquis.  Desires no further intervention at this time.  Follow.

## 2019-12-24 NOTE — Assessment & Plan Note (Signed)
On prozac.  Discussed with her today.  Does not feel needs any further intervention.  Follow.

## 2019-12-24 NOTE — Assessment & Plan Note (Signed)
Low cholesterol diet and exercise.  Follow lipid panel.   

## 2020-01-03 ENCOUNTER — Encounter: Payer: Self-pay | Admitting: *Deleted

## 2020-01-04 ENCOUNTER — Telehealth: Payer: Self-pay | Admitting: Internal Medicine

## 2020-01-04 NOTE — Telephone Encounter (Signed)
Please contact pt regarding need for colon cancer screening.

## 2020-01-04 NOTE — Telephone Encounter (Signed)
Rejection Reason - Patient did not respond" [facility] said on Jan 03, 2020 9:02 AM

## 2020-01-05 NOTE — Telephone Encounter (Signed)
Mychart sent to patient.

## 2020-01-11 ENCOUNTER — Other Ambulatory Visit: Payer: Self-pay

## 2020-01-11 ENCOUNTER — Telehealth (INDEPENDENT_AMBULATORY_CARE_PROVIDER_SITE_OTHER): Payer: Self-pay | Admitting: Gastroenterology

## 2020-01-11 DIAGNOSIS — Z1211 Encounter for screening for malignant neoplasm of colon: Secondary | ICD-10-CM

## 2020-01-11 DIAGNOSIS — Z800 Family history of malignant neoplasm of digestive organs: Secondary | ICD-10-CM

## 2020-01-11 MED ORDER — NA SULFATE-K SULFATE-MG SULF 17.5-3.13-1.6 GM/177ML PO SOLN
1.0000 | Freq: Once | ORAL | 0 refills | Status: AC
Start: 2020-01-11 — End: 2020-01-11

## 2020-01-11 NOTE — Progress Notes (Signed)
Gastroenterology Pre-Procedure Review  Request Date: Friday 02/10/20 Requesting Physician: Dr. Vicente Males  PATIENT REVIEW QUESTIONS: The patient responded to the following health history questions as indicated:    1. Are you having any GI issues? yes (Fullness and bloating experienced immediately after meals.  Normal bowel habits.  Declined an office visit at this time.) 2. Do you have a personal history of Polyps? no 3. Do you have a family history of Colon Cancer or Polyps? yes (father had colon cancer) 4. Diabetes Mellitus? no 5. Joint replacements in the past 12 months?no 6. Major health problems in the past 3 months?yes (ER Visit for Atrial Flutter 08/29/19 [facility]) 7. Any artificial heart valves, MVP, or defibrillator?no    MEDICATIONS & ALLERGIES:    Patient reports the following regarding taking any anticoagulation/antiplatelet therapy:   Plavix, Coumadin, Eliquis, Xarelto, Lovenox, Pradaxa, Brilinta, or Effient? yes (Eliquis prescribed by Dr.Whalen Blood Thinner Request and Cardiac Request to be faxed) Aspirin? yes (325 mg blood thinner to be faxed to Dr. Remus Blake)  Patient confirms/reports the following medications:  Current Outpatient Medications  Medication Sig Dispense Refill  . aspirin 325 MG EC tablet Take 325 mg by mouth as needed.     Marland Kitchen BIOTIN 5000 PO Take by mouth.    . Cholecalciferol 25 MCG (1000 UT) tablet Take by mouth.    . diltiazem (CARDIZEM) 60 MG tablet Take 60 mg by mouth 2 (two) times daily.    Marland Kitchen dofetilide (TIKOSYN) 500 MCG capsule Take by mouth.    Arne Cleveland 5 MG TABS tablet Take 5 mg by mouth 2 (two) times daily.    Marland Kitchen FLUoxetine (PROZAC) 20 MG capsule TAKE ONE CAPSULE BY MOUTH DAILY 90 capsule 0  . ibuprofen (ADVIL,MOTRIN) 200 MG tablet Take 400 mg by mouth every 6 (six) hours as needed.    . Magnesium 500 MG CAPS Take 500 mg by mouth daily.    Marland Kitchen senna-docusate (SENOKOT-S) 8.6-50 MG tablet Take 1 tablet by mouth daily.    Marland Kitchen zinc gluconate 50 MG tablet  Take by mouth.    . Na Sulfate-K Sulfate-Mg Sulf 17.5-3.13-1.6 GM/177ML SOLN Take 1 kit by mouth once for 1 dose. 354 mL 0   No current facility-administered medications for this visit.    Patient confirms/reports the following allergies:  Allergies  Allergen Reactions  . Epinephrine     Other reaction(s): Other (See Comments) heart races  . Aciphex [Rabeprazole] Other (See Comments)    Headaches  . Albuterol Other (See Comments)    Intolerance  . Amiodarone     Other reaction(s): Tremor (intolerance) Dizziness, numbness of fingers, cognitive changes    Orders Placed This Encounter  Procedures  . Procedural/ Surgical Case Request: COLONOSCOPY WITH PROPOFOL    Standing Status:   Standing    Number of Occurrences:   1    Order Specific Question:   Pre-op diagnosis    Answer:   father colon cancer, screening colonoscopy    Order Specific Question:   CPT Code    Answer:   [phone]    AUTHORIZATION INFORMATION Primary Insurance: 1D#: Group #:  Secondary Insurance: 1D#: Group #:  SCHEDULE INFORMATION: Date: 02/10/20 Time: Location:[facility]

## 2020-01-16 ENCOUNTER — Telehealth: Payer: Self-pay

## 2020-01-16 NOTE — Telephone Encounter (Signed)
Patient has been contacted to advise regarding Eliquis 5 mg and Aspirin 325mg  prior to Friday 02/10/20 Colonoscopy. Per Dr. 14/10/21 Blood Thinner Information Request patient has been advised to stop Eliquis 2-3 days prior to procedure.  Restart 1 day after procedure or surgeons preference.  Aspirin 325mg  may be stopped 2-3 days prior to procedure restart blood thinner 1 day after procedure or surgeons preference.  Thanks,  [place], 

## 2020-02-01 ENCOUNTER — Ambulatory Visit (INDEPENDENT_AMBULATORY_CARE_PROVIDER_SITE_OTHER): Payer: Medicare HMO

## 2020-02-01 ENCOUNTER — Encounter: Payer: Self-pay | Admitting: Internal Medicine

## 2020-02-01 VITALS — Ht 68.0 in | Wt 204.0 lb

## 2020-02-01 DIAGNOSIS — Z0000 Encounter for general adult medical examination without abnormal findings: Secondary | ICD-10-CM | POA: Diagnosis not present

## 2020-02-01 NOTE — Patient Instructions (Addendum)
Ms. Caitlin [name] , Thank you for taking time to come for your Medicare Wellness Visit. I appreciate your ongoing commitment to your health goals. Please review the following plan we discussed and let me know if I can assist you in the future.   These are the goals we discussed: Goals      Patient Stated   .  Increase physical activity (pt-stated)      Silver sneaker program Exercise as tolerated       This is a list of the screening recommended for you and due dates:  Health Maintenance  Topic Date Due  .  Hepatitis C: One time screening is recommended by Center for Disease Control  (CDC) for  adults born from [birth year] through 1967.   Never done  . Tetanus Vaccine  07/25/2012  . Mammogram  07/26/2018  . Pneumonia vaccines (1 of 2 - PCV13) Never done  . DEXA scan (bone density measurement)  01/31/2021*  . Colon Cancer Screening  01/31/2021*  . COVID-19 Vaccine  Completed  . Flu Shot  Discontinued  *Topic was postponed. The date shown is not the original due date.    Immunizations Immunization History  Administered Date(s) Administered  . Moderna SARS-COVID-2 Vaccination 09/16/2019, 10/13/2019   Keep all routine maintenance appointments.   Follow up 03/28/20 @ 10:30  Advanced directives: Copy mailed per request.   Conditions/risks identified: none new  Follow up in one year for your annual wellness visit.   Preventive Care 86 Years and Older, Female Preventive care refers to lifestyle choices and visits with your health care provider that can promote health and wellness. What does preventive care include?  A yearly physical exam. This is also called an annual well check.  Dental exams once or twice a year.  Routine eye exams. Ask your health care provider how often you should have your eyes checked.  Personal lifestyle choices, including:  Daily care of your teeth and gums.  Regular physical activity.  Eating a healthy diet.  Avoiding tobacco and drug use.  Limiting  alcohol use.  Practicing safe sex.  Taking low-dose aspirin every day.  Taking vitamin and mineral supplements as recommended by your health care provider. What happens during an annual well check? The services and screenings done by your health care provider during your annual well check will depend on your age, overall health, lifestyle risk factors, and family history of disease. Counseling  Your health care provider may ask you questions about your:  Alcohol use.  Tobacco use.  Drug use.  Emotional well-being.  Home and relationship well-being.  Sexual activity.  Eating habits.  History of falls.  Memory and ability to understand (cognition).  Work and work Astronomer.  Reproductive health. Screening  You may have the following tests or measurements:  Height, weight, and BMI.  Blood pressure.  Lipid and cholesterol levels. These may be checked every 5 years, or more frequently if you are over 75 years old.  Skin check.  Lung cancer screening. You may have this screening every year starting at age 64 if you have a 30-pack-year history of smoking and currently smoke or have quit within the past 15 years.  Fecal occult blood test (FOBT) of the stool. You may have this test every year starting at age 72.  Flexible sigmoidoscopy or colonoscopy. You may have a sigmoidoscopy every 5 years or a colonoscopy every 10 years starting at age 49.  Hepatitis C blood test.  Hepatitis B blood test.  Sexually transmitted disease (STD) testing.  Diabetes screening. This is done by checking your blood sugar (glucose) after you have not eaten for a while (fasting). You may have this done every 1-3 years.  Bone density scan. This is done to screen for osteoporosis. You may have this done starting at age 50.  Mammogram. This may be done every 1-2 years. Talk to your health care provider about how often you should have regular mammograms. Talk with your health care provider  about your test results, treatment options, and if necessary, the need for more tests. Vaccines  Your health care provider may recommend certain vaccines, such as:  Influenza vaccine. This is recommended every year.  Tetanus, diphtheria, and acellular pertussis (Tdap, Td) vaccine. You may need a Td booster every 10 years.  Zoster vaccine. You may need this after age 34.  Pneumococcal 13-valent conjugate (PCV13) vaccine. One dose is recommended after age 64.  Pneumococcal polysaccharide (PPSV23) vaccine. One dose is recommended after age 36. Talk to your health care provider about which screenings and vaccines you need and how often you need them. This information is not intended to replace advice given to you by your health care provider. Make sure you discuss any questions you have with your health care provider. Document Released: 03/16/2015 Document Revised: 11/07/2015 Document Reviewed: 12/19/2014 Elsevier Interactive Patient Education  2017 [place] Prevention in the Home Falls can cause injuries. They can happen to people of all ages. There are many things you can do to make your home safe and to help prevent falls. What can I do on the outside of my home?  Regularly fix the edges of walkways and driveways and fix any cracks.  Remove anything that might make you trip as you walk through a door, such as a raised step or threshold.  Trim any bushes or trees on the path to your home.  Use bright outdoor lighting.  Clear any walking paths of anything that might make someone trip, such as rocks or tools.  Regularly check to see if handrails are loose or broken. Make sure that both sides of any steps have handrails.  Any raised decks and porches should have guardrails on the edges.  Have any leaves, snow, or ice cleared regularly.  Use sand or salt on walking paths during winter.  Clean up any spills in your garage right away. This includes oil or grease  spills. What can I do in the bathroom?  Use night lights.  Install grab bars by the toilet and in the tub and shower. Do not use towel bars as grab bars.  Use non-skid mats or decals in the tub or shower.  If you need to sit down in the shower, use a plastic, non-slip stool.  Keep the floor dry. Clean up any water that spills on the floor as soon as it happens.  Remove soap buildup in the tub or shower regularly.  Attach bath mats securely with double-sided non-slip rug tape.  Do not have throw rugs and other things on the floor that can make you trip. What can I do in the bedroom?  Use night lights.  Make sure that you have a light by your bed that is easy to reach.  Do not use any sheets or blankets that are too big for your bed. They should not hang down onto the floor.  Have a firm chair that has side arms. You can use this for support while you get  dressed.  Do not have throw rugs and other things on the floor that can make you trip. What can I do in the kitchen?  Clean up any spills right away.  Avoid walking on wet floors.  Keep items that you use a lot in easy-to-reach places.  If you need to reach something above you, use a strong step stool that has a grab bar.  Keep electrical cords out of the way.  Do not use floor polish or wax that makes floors slippery. If you must use wax, use non-skid floor wax.  Do not have throw rugs and other things on the floor that can make you trip. What can I do with my stairs?  Do not leave any items on the stairs.  Make sure that there are handrails on both sides of the stairs and use them. Fix handrails that are broken or loose. Make sure that handrails are as long as the stairways.  Check any carpeting to make sure that it is firmly attached to the stairs. Fix any carpet that is loose or worn.  Avoid having throw rugs at the top or bottom of the stairs. If you do have throw rugs, attach them to the floor with carpet  tape.  Make sure that you have a light switch at the top of the stairs and the bottom of the stairs. If you do not have them, ask someone to add them for you. What else can I do to help prevent falls?  Wear shoes that:  Do not have high heels.  Have rubber bottoms.  Are comfortable and fit you well.  Are closed at the toe. Do not wear sandals.  If you use a stepladder:  Make sure that it is fully opened. Do not climb a closed stepladder.  Make sure that both sides of the stepladder are locked into place.  Ask someone to hold it for you, if possible.  Clearly mark and make sure that you can see:  Any grab bars or handrails.  First and last steps.  Where the edge of each step is.  Use tools that help you move around (mobility aids) if they are needed. These include:  Canes.  Walkers.  Scooters.  Crutches.  Turn on the lights when you go into a dark area. Replace any light bulbs as soon as they burn out.  Set up your furniture so you have a clear path. Avoid moving your furniture around.  If any of your floors are uneven, fix them.  If there are any pets around you, be aware of where they are.  Review your medicines with your doctor. Some medicines can make you feel dizzy. This can increase your chance of falling. Ask your doctor what other things that you can do to help prevent falls. This information is not intended to replace advice given to you by your health care provider. Make sure you discuss any questions you have with your health care provider. Document Released: 12/14/2008 Document Revised: 07/26/2015 Document Reviewed: 03/24/2014 Elsevier Interactive Patient Education  2017 [employer].
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
## 2020-02-08 ENCOUNTER — Other Ambulatory Visit: Admission: RE | Admit: 2020-02-08 | Payer: Medicare HMO | Source: Ambulatory Visit

## 2020-02-08 ENCOUNTER — Other Ambulatory Visit (HOSPITAL_COMMUNITY)
Admission: RE | Admit: 2020-02-08 | Discharge: 2020-02-08 | Disposition: A | Discharge status: Home / Self Care | Payer: Medicare HMO | Source: Ambulatory Visit | Attending: Gastroenterology | Admitting: Gastroenterology

## 2020-02-08 ENCOUNTER — Other Ambulatory Visit: Payer: Self-pay

## 2020-02-08 DIAGNOSIS — Z20822 Contact with and (suspected) exposure to covid-19: Secondary | ICD-10-CM | POA: Insufficient documentation

## 2020-02-08 DIAGNOSIS — Z01812 Encounter for preprocedural laboratory examination: Secondary | ICD-10-CM | POA: Insufficient documentation

## 2020-02-08 LAB — SARS CORONAVIRUS 2 (TAT 6-24 HRS): SARS Coronavirus 2: NEGATIVE

## 2020-02-10 ENCOUNTER — Ambulatory Visit
Admission: RE | Admit: 2020-02-10 | Discharge: 2020-02-10 | Disposition: A | Discharge status: Home / Self Care | Payer: Medicare HMO | Attending: Gastroenterology | Admitting: Gastroenterology

## 2020-02-10 ENCOUNTER — Other Ambulatory Visit: Payer: Self-pay

## 2020-02-10 ENCOUNTER — Encounter: Payer: Self-pay | Admitting: Gastroenterology

## 2020-02-10 ENCOUNTER — Ambulatory Visit: Payer: Medicare HMO | Admitting: Certified Registered"

## 2020-02-10 ENCOUNTER — Encounter
Admission: RE | Disposition: A | Discharge status: Home / Self Care | Payer: Self-pay | Source: Home / Self Care | Attending: Gastroenterology

## 2020-02-10 DIAGNOSIS — K5730 Diverticulosis of large intestine without perforation or abscess without bleeding: Secondary | ICD-10-CM | POA: Diagnosis not present

## 2020-02-10 DIAGNOSIS — Z800 Family history of malignant neoplasm of digestive organs: Secondary | ICD-10-CM | POA: Insufficient documentation

## 2020-02-10 DIAGNOSIS — Z7982 Long term (current) use of aspirin: Secondary | ICD-10-CM | POA: Insufficient documentation

## 2020-02-10 DIAGNOSIS — Z1211 Encounter for screening for malignant neoplasm of colon: Secondary | ICD-10-CM | POA: Insufficient documentation

## 2020-02-10 DIAGNOSIS — Z7901 Long term (current) use of anticoagulants: Secondary | ICD-10-CM | POA: Diagnosis not present

## 2020-02-10 HISTORY — PX: COLONOSCOPY WITH PROPOFOL: SHX5780

## 2020-02-10 SURGERY — COLONOSCOPY WITH PROPOFOL
Anesthesia: General

## 2020-02-10 MED ORDER — FENTANYL CITRATE (PF) 100 MCG/2ML IJ SOLN
INTRAMUSCULAR | Status: DC | PRN
  Administered 2020-02-10 (×2): 50 ug via INTRAVENOUS

## 2020-02-10 MED ORDER — SODIUM CHLORIDE 0.9 % IV SOLN
INTRAVENOUS | Status: DC
  Administered 2020-02-10: 20 mL/h via INTRAVENOUS

## 2020-02-10 MED ORDER — FENTANYL CITRATE (PF) 100 MCG/2ML IJ SOLN
INTRAMUSCULAR | Status: AC
  Filled 2020-02-10: qty 2

## 2020-02-10 MED ORDER — PROPOFOL 500 MG/50ML IV EMUL
INTRAVENOUS | Status: DC | PRN
  Administered 2020-02-10: 120 ug/kg/min via INTRAVENOUS

## 2020-02-10 MED ORDER — PROPOFOL 10 MG/ML IV BOLUS
INTRAVENOUS | Status: DC | PRN
  Administered 2020-02-10: 70 mg via INTRAVENOUS
  Administered 2020-02-10: 30 mg via INTRAVENOUS

## 2020-02-10 MED ORDER — LIDOCAINE 2% (20 MG/ML) 5 ML SYRINGE
INTRAMUSCULAR | Status: DC | PRN
  Administered 2020-02-10: 25 mg via INTRAVENOUS

## 2020-02-10 MED ORDER — PHENYLEPHRINE HCL (PRESSORS) 10 MG/ML IV SOLN
INTRAVENOUS | Status: DC | PRN
  Administered 2020-02-10 (×2): 100 ug via INTRAVENOUS

## 2020-02-10 NOTE — Transfer of Care (Signed)
Immediate Anesthesia Transfer of Care Note  Patient: Caitlin [name]  Procedure(s) Performed: COLONOSCOPY WITH PROPOFOL (N/A )  Patient Location: Endoscopy Unit  Anesthesia Type:General  Level of Consciousness: awake  Airway & Oxygen Therapy: Patient Spontanous Breathing  Post-op Assessment: Report given to RN and Post -op Vital signs reviewed and stable  Post vital signs: Reviewed  Last Vitals:  Vitals Value Taken Time  BP 93/57 02/10/20 1117  Temp 36.7 C 02/10/20 1117  Pulse 88 02/10/20 1117  Resp 14 02/10/20 1117  SpO2 95 % 02/10/20 1117    Last Pain:  Vitals:   02/10/20 1115  TempSrc: Temporal  PainSc: Asleep         Complications: No complications documented.
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
## 2020-02-10 NOTE — Anesthesia Postprocedure Evaluation (Signed)
Anesthesia Post Note  Patient: Caitlin [name]  Procedure(s) Performed: COLONOSCOPY WITH PROPOFOL (N/A )  Patient location during evaluation: Endoscopy Anesthesia Type: General Level of consciousness: awake and alert Pain management: pain level controlled Vital Signs Assessment: post-procedure vital signs reviewed and stable Respiratory status: spontaneous breathing, nonlabored ventilation, respiratory function stable and patient connected to nasal cannula oxygen Cardiovascular status: blood pressure returned to baseline and stable Postop Assessment: no apparent nausea or vomiting Anesthetic complications: no   No complications documented.   Last Vitals:  Vitals:   02/10/20 1135 02/10/20 1145  BP: 99/71 108/81  Pulse: (!) 108 (!) 103  Resp: 12 16  Temp:    SpO2: 98% 100%    Last Pain:  Vitals:   02/10/20 1145  TempSrc:   PainSc: 0-No pain                 Lenard Simmer

## 2020-02-10 NOTE — Op Note (Signed)
[facility] Gastroenterology Patient Name: Shaneece Stockburger Procedure Date: 02/10/2020 10:31 AM MRN: [id number] Account #: [id number] Date of Birth: 1956-02-11 Admit Type: Outpatient Age: 64 Room: [facility] ENDO ROOM 4 Gender: Female Note Status: Finalized Procedure:             Colonoscopy Indications:           Screening in patient at increased risk: Family history                         of 1st-degree relative with colorectal cancer Providers:             Wyline Mood MD, MD Medicines:             Monitored Anesthesia Care Complications:         No immediate complications. Procedure:             Pre-Anesthesia Assessment:                        - Prior to the procedure, a History and Physical was                         performed, and patient medications, allergies and                         sensitivities were reviewed. The patient's tolerance                         of previous anesthesia was reviewed.                        - The risks and benefits of the procedure and the                         sedation options and risks were discussed with the                         patient. All questions were answered and informed                         consent was obtained.                        - ASA Grade Assessment: II - A patient with mild                         systemic disease.                        After obtaining informed consent, the colonoscope was                         passed under direct vision. Throughout the procedure,                         the patient's blood pressure, pulse, and oxygen                         saturations were monitored continuously. The  Colonoscope was introduced through the anus and                         advanced to the the cecum, identified by the                         appendiceal orifice. The colonoscopy was performed                         with ease. The colonoscopy was performed with moderate                          difficulty due to significant looping. Successful                         completion of the procedure was aided by withdrawing                         the scope and replacing with the pediatric                         colonoscope. The quality of the bowel preparation was                         excellent. Findings:      The perianal and digital rectal examinations were normal.      Multiple small-mouthed diverticula were found in the sigmoid colon.      The exam was otherwise without abnormality on direct and retroflexion       views. Impression:            - Diverticulosis in the sigmoid colon.                        - The examination was otherwise normal on direct and                         retroflexion views.                        - No specimens collected. Recommendation:        - Discharge patient to home (with escort).                        - Resume previous diet.                        - Continue present medications.                        - Repeat colonoscopy in 5 years for screening purposes. Procedure Code(s):     --- Professional ---                        [id number], Colonoscopy, flexible; diagnostic, including                         collection of specimen(s) by brushing or washing, when                         performed (  separate procedure) Diagnosis Code(s):     --- Professional ---                        Z80.0, Family history of malignant neoplasm of                         digestive organs                        K57.30, Diverticulosis of large intestine without                         perforation or abscess without bleeding CPT copyright 2019 American Medical Association. All rights reserved. The codes documented in this report are preliminary and upon coder review may  be revised to meet current compliance requirements. Wyline Mood, MD Wyline Mood MD, MD 02/10/2020 11:14:23 AM This report has been signed electronically. Number of Addenda: 0 Note Initiated On:  02/10/2020 10:31 AM Scope Withdrawal Time: 0 hours 10 minutes 24 seconds  Total Procedure Duration: 0 hours 26 minutes 29 seconds  Estimated Blood Loss:  Estimated blood loss: none.      [facility]
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
## 2020-02-13 ENCOUNTER — Encounter: Payer: Self-pay | Admitting: Gastroenterology

## 2020-02-14 ENCOUNTER — Telehealth: Payer: Self-pay | Admitting: Internal Medicine

## 2020-02-14 NOTE — Telephone Encounter (Signed)
Please notify pt that she is overdue mammogram.  Please schedule.  Thanks.

## 2020-02-15 NOTE — Telephone Encounter (Signed)
Spoke with patient and advised she is due for her MM and order is in the system. Patient will call to schedule her own appt.

## 2020-03-11 ENCOUNTER — Other Ambulatory Visit: Payer: Self-pay | Admitting: Internal Medicine

## 2020-03-28 ENCOUNTER — Telehealth (INDEPENDENT_AMBULATORY_CARE_PROVIDER_SITE_OTHER): Payer: Medicare HMO | Admitting: Internal Medicine

## 2020-03-28 DIAGNOSIS — F32A Depression, unspecified: Secondary | ICD-10-CM | POA: Diagnosis not present

## 2020-03-28 DIAGNOSIS — E7800 Pure hypercholesterolemia, unspecified: Secondary | ICD-10-CM

## 2020-03-28 DIAGNOSIS — K760 Fatty (change of) liver, not elsewhere classified: Secondary | ICD-10-CM | POA: Diagnosis not present

## 2020-03-28 DIAGNOSIS — R112 Nausea with vomiting, unspecified: Secondary | ICD-10-CM | POA: Diagnosis not present

## 2020-03-28 DIAGNOSIS — I4891 Unspecified atrial fibrillation: Secondary | ICD-10-CM
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
## 2020-04-02 ENCOUNTER — Other Ambulatory Visit: Payer: Self-pay

## 2020-04-02 ENCOUNTER — Ambulatory Visit
Admission: RE | Admit: 2020-04-02 | Discharge: 2020-04-02 | Disposition: A | Discharge status: Home / Self Care | Payer: Medicare HMO | Source: Ambulatory Visit | Attending: Internal Medicine | Admitting: Internal Medicine

## 2020-04-02 ENCOUNTER — Encounter: Payer: Self-pay | Admitting: Internal Medicine

## 2020-04-02 DIAGNOSIS — Z1231 Encounter for screening mammogram for malignant neoplasm of breast: Secondary | ICD-10-CM | POA: Diagnosis not present

## 2020-04-02 NOTE — Assessment & Plan Note (Signed)
Low cholesterol diet and exercise.  Follow lipid panel.   

## 2020-04-02 NOTE — Assessment & Plan Note (Signed)
On prozac.  Stable.   

## 2020-04-02 NOTE — Assessment & Plan Note (Signed)
Previous ultrasound - fatty liver.  Diet, exercise and weight loss.  Follow liver function tests.

## 2020-04-02 NOTE — Assessment & Plan Note (Signed)
Followed by cardiology - afib/aflutter.  S/p repeat ablation 08/2019.  Persistent paroxysmal afib/aflutter. On eliquis.  Keep f/u in 04/2020 with cardiology.

## 2020-04-02 NOTE — Assessment & Plan Note (Signed)
On protonix.  Doing better overall.  Follow.

## 2020-06-19 ENCOUNTER — Other Ambulatory Visit: Payer: Self-pay | Admitting: Internal Medicine

## 2020-07-17 ENCOUNTER — Ambulatory Visit (INDEPENDENT_AMBULATORY_CARE_PROVIDER_SITE_OTHER): Payer: Medicare HMO | Admitting: Internal Medicine

## 2020-07-17 ENCOUNTER — Other Ambulatory Visit: Payer: Self-pay

## 2020-07-17 ENCOUNTER — Ambulatory Visit (INDEPENDENT_AMBULATORY_CARE_PROVIDER_SITE_OTHER): Payer: Medicare HMO

## 2020-07-17 VITALS — BP 122/70 | HR 80 | Temp 97.9°F | Resp 16 | Ht 68.0 in | Wt 210.0 lb

## 2020-07-17 DIAGNOSIS — M25561 Pain in right knee: Secondary | ICD-10-CM

## 2020-07-17 DIAGNOSIS — R5383 Other fatigue: Secondary | ICD-10-CM

## 2020-07-17 DIAGNOSIS — K5730 Diverticulosis of large intestine without perforation or abscess without bleeding: Secondary | ICD-10-CM

## 2020-07-17 DIAGNOSIS — Z1159 Encounter for screening for other viral diseases: Secondary | ICD-10-CM | POA: Diagnosis not present

## 2020-07-17 DIAGNOSIS — I4891 Unspecified atrial fibrillation: Secondary | ICD-10-CM

## 2020-07-17 DIAGNOSIS — I484 Atypical atrial flutter: Secondary | ICD-10-CM

## 2020-07-17 DIAGNOSIS — F32A Depression, unspecified: Secondary | ICD-10-CM

## 2020-07-17 DIAGNOSIS — K760 Fatty (change of) liver, not elsewhere classified: Secondary | ICD-10-CM

## 2020-07-17 DIAGNOSIS — E7800 Pure hypercholesterolemia, unspecified: Secondary | ICD-10-CM

## 2020-07-17 LAB — CBC WITH DIFFERENTIAL/PLATELET
Basophils Absolute: 0.1 10*3/uL (ref 0.0–0.1)
Basophils Relative: 1.7 % (ref 0.0–3.0)
Eosinophils Absolute: 0.2 10*3/uL (ref 0.0–0.7)
Eosinophils Relative: 3.2 % (ref 0.0–5.0)
HCT: 38.5 % (ref 36.0–46.0)
Hemoglobin: 12.9 g/dL (ref 12.0–15.0)
Lymphocytes Relative: 26.6 % (ref 12.0–46.0)
Lymphs Abs: 1.5 10*3/uL (ref 0.7–4.0)
MCHC: 33.5 g/dL (ref 30.0–36.0)
MCV: 87.2 fl (ref 78.0–100.0)
Monocytes Absolute: 0.6 10*3/uL (ref 0.1–1.0)
Monocytes Relative: 10.2 % (ref 3.0–12.0)
Neutro Abs: 3.2 10*3/uL (ref 1.4–7.7)
Neutrophils Relative %: 58.3 % (ref 43.0–77.0)
Platelets: 243 10*3/uL (ref 150.0–400.0)
RBC: 4.42 Mil/uL (ref 3.87–5.11)
RDW: 13.9 % (ref 11.5–15.5)
WBC: 5.5 10*3/uL (ref 4.0–10.5)

## 2020-07-17 LAB — BASIC METABOLIC PANEL
BUN: 14 mg/dL (ref 6–23)
CO2: 26 mEq/L (ref 19–32)
Calcium: 9.2 mg/dL (ref 8.4–10.5)
Chloride: 103 mEq/L (ref 96–112)
Creatinine, Ser: 0.6 mg/dL (ref 0.40–1.20)
GFR: 93.19 mL/min (ref >60.00)
Glucose, Bld: 97 mg/dL (ref 70–99)
Potassium: 4.5 mEq/L (ref 3.5–5.1)
Sodium: 136 mEq/L (ref 135–145)

## 2020-07-17 LAB — HEPATIC FUNCTION PANEL
ALT: 13 U/L (ref 0–35)
AST: 16 U/L (ref 0–37)
Albumin: 4.3 g/dL (ref 3.5–5.2)
Alkaline Phosphatase: 98 U/L (ref 39–117)
Bilirubin, Direct: 0.1 mg/dL (ref 0.0–0.3)
Total Bilirubin: 0.4 mg/dL (ref 0.2–1.2)
Total Protein: 6.8 g/dL (ref 6.0–8.3)

## 2020-07-17 LAB — LIPID PANEL
Cholesterol: 200 mg/dL (ref 0–200)
HDL: 67.8 mg/dL (ref >39.00)
LDL Cholesterol: 104 mg/dL — ABNORMAL HIGH (ref 0–99)
NonHDL: 131.96
Total CHOL/HDL Ratio: 3
Triglycerides: 140 mg/dL (ref 0.0–149.0)
VLDL: 28 mg/dL (ref 0.0–40.0)
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
## 2020-07-18 ENCOUNTER — Encounter: Payer: Self-pay | Admitting: Internal Medicine

## 2020-07-18 LAB — HEPATITIS C ANTIBODY
Hepatitis C Ab: NONREACTIVE
SIGNAL TO CUT-OFF: 0.01 (ref <1.00)

## 2020-07-18 NOTE — Assessment & Plan Note (Signed)
Previous ultrasound - revealed fatty liver.  Diet and exercise.  Follow lipid panel.   

## 2020-07-18 NOTE — Assessment & Plan Note (Signed)
Some fatigue and daytime somnolence.  Discussed.  Discussed further w/up and evaluation for sleep apnea.  She declines.  Discussed not driving if feels sleepy, etc.  Check routine labs as outlined.

## 2020-07-18 NOTE — Assessment & Plan Note (Signed)
The 10-year ASCVD risk score Denman George DC Montez Hageman., et al., 2013) is: 5.2%   Values used to calculate the score:     Age: 65 years     Sex: Female     Is Non-Hispanic African American: No     Diabetic: No     Tobacco smoker: No     Systolic Blood Pressure: 122 mmHg     Is BP treated: No     HDL Cholesterol: 67.8 mg/dL     Total Cholesterol: 200 mg/dL  Low cholesterol diet and exercise.  Follow lipid panel.

## 2020-07-18 NOTE — Assessment & Plan Note (Signed)
S/p ablation 08/2019.  Was having persistent afib/aflutter.  Over the last two months - improved.  No significant problem with increased heart rate or palpitations now.  Continue eliquis and diltiazem.

## 2020-07-18 NOTE — Assessment & Plan Note (Signed)
Persistent right knee pain.  Check knee xray.  Further w/up pending results.

## 2020-07-18 NOTE — Assessment & Plan Note (Signed)
Continue prozac.  Stable.  

## 2020-07-18 NOTE — Assessment & Plan Note (Signed)
Colonoscopy 02/10/20 - diverticulosis.   

## 2020-08-07 ENCOUNTER — Telehealth: Payer: Self-pay | Admitting: Internal Medicine

## 2020-08-07 NOTE — Telephone Encounter (Signed)
Call pt  She should not take any nsaid ( naproxen) when on eliquis  Tylenol is better for pain if needs it

## 2020-08-07 NOTE — Telephone Encounter (Signed)
Pt wanted to know if it was ok for her to take naproxen while she is on a blood thinner

## 2020-08-07 NOTE — Telephone Encounter (Signed)
Patient is aware of below. 

## 2020-08-07 NOTE — Telephone Encounter (Signed)
Can she use naproxen if she is on blood thinners?

## 2020-09-05 ENCOUNTER — Encounter: Payer: Self-pay | Admitting: Internal Medicine

## 2020-09-10 NOTE — Telephone Encounter (Signed)
If she is continuing to have increased pain, she needs to be evaluated to be able to determine best treatment and further w/up.  Would recommend increased antiinflammatories on eliquis.  Can try to work in here, but given that she has seen chiropractor and been to Urgent care - [facility] would be recommended - can evaluate and treat.   May save two trips.

## 2020-09-10 NOTE — Telephone Encounter (Signed)
Please advise 

## 2020-09-11 NOTE — Telephone Encounter (Signed)
Left message to call office

## 2020-09-11 NOTE — Telephone Encounter (Signed)
Patient returned my call she stated she ha snot increased her antiinflammatories that she has sen the Chiropractor and is feeling better.  Patient stated if sciatic pain returned sh e would go to [facility] - ortho walk in .

## 2020-09-18 ENCOUNTER — Telehealth (INDEPENDENT_AMBULATORY_CARE_PROVIDER_SITE_OTHER): Payer: Medicare HMO | Admitting: Internal Medicine

## 2020-09-18 ENCOUNTER — Encounter: Payer: Self-pay | Admitting: Internal Medicine

## 2020-09-18 ENCOUNTER — Other Ambulatory Visit: Payer: Self-pay

## 2020-09-18 VITALS — Ht 68.0 in | Wt 210.0 lb

## 2020-09-18 DIAGNOSIS — R0781 Pleurodynia: Secondary | ICD-10-CM

## 2020-09-18 DIAGNOSIS — W19XXXA Unspecified fall, initial encounter: Secondary | ICD-10-CM | POA: Diagnosis not present

## 2020-09-18 DIAGNOSIS — M5430 Sciatica, unspecified side: Secondary | ICD-10-CM | POA: Insufficient documentation

## 2020-09-18 DIAGNOSIS — I4891 Unspecified atrial fibrillation: Secondary | ICD-10-CM

## 2020-09-18 DIAGNOSIS — S0993XA Unspecified injury of face, initial encounter: Secondary | ICD-10-CM | POA: Insufficient documentation

## 2020-09-18 DIAGNOSIS — M5431 Sciatica, right side: Secondary | ICD-10-CM

## 2020-09-18 DIAGNOSIS — E7800 Pure hypercholesterolemia, unspecified: Secondary | ICD-10-CM

## 2020-09-18 DIAGNOSIS — R519 Headache, unspecified: Secondary | ICD-10-CM | POA: Insufficient documentation

## 2020-09-18 MED ORDER — METHYLPREDNISOLONE 4 MG PO TBPK
ORAL_TABLET | ORAL | 0 refills | Status: DC
Start: 2020-09-18 — End: 2022-04-11

## 2020-09-18 MED ORDER — METHYLPREDNISOLONE 4 MG PO TBPK: ORAL_TABLET | ORAL | 0 refills | Status: DC

## 2020-09-18 NOTE — Assessment & Plan Note (Signed)
Had a fall last week.  Facial bruising as outlined.  Persistent head pressure.  Intermittent lightheadedness.  Will obtain CT scan without contrast.  She lives in [place].  Wants to get this done at [name].

## 2020-09-18 NOTE — Assessment & Plan Note (Signed)
Had a fall last week as outlined.  She did hit her head/face.  Noticed facial bruising.  Reports persistent increased head pressure and lightheadedness.  Will obtain CT scan without contrast.
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
## 2020-09-18 NOTE — Assessment & Plan Note (Signed)
Persistent pain.  Starts in her right buttock and extends down the leg.  Was diagnosed with sciatica at urgent care.  Placed on anti-inflammatories.  She is on Eliquis.  Stop anti-inflammatory.  Medrol Dosepak as directed.  Discussed possible side effects.  Follow.  Discussed stretches.

## 2020-09-18 NOTE — Assessment & Plan Note (Signed)
Had the fall last week as outlined.  She did hit her head/face.  Noticed facial bruising.  Reports persistent increased head pressure and lightheadedness.  Will obtain CT scan without contrast.  Wants to hold on rib xray. No increased sob.  No residual knee pain.

## 2020-09-18 NOTE — Assessment & Plan Note (Addendum)
Recent fall.  Right rib pain.  She is able to take a good deep breath without significant shortness of breath or pain.  Wants to hold on x-ray.  Continue to encourage good deep breaths.

## 2020-09-19 ENCOUNTER — Telehealth: Payer: Self-pay | Admitting: Internal Medicine

## 2020-09-19 NOTE — Telephone Encounter (Signed)
Notify Ms Macmullen that her CT head is negative.  No acute abnormality.  Let us know if any problems.

## 2020-09-19 NOTE — Telephone Encounter (Signed)
Pt called and notified. No further questions or concerns.

## 2020-09-21 NOTE — Telephone Encounter (Signed)
Pt got your message and does not need a returned phone call at this time. Thanks from the pt

## 2020-09-21 NOTE — Telephone Encounter (Signed)
Was unable to reach patient and Museum/gallery exhibitions officer. Called patients husband who is on the DPR to get another pharmacy to send prednisone to. I have phoned in rx for medrol dose pack- 6 day taper to [facility] in [place], [place]. Patients husband is aware.

## 2020-09-21 NOTE — Telephone Encounter (Signed)
LMTCB

## 2020-09-21 NOTE — Telephone Encounter (Signed)
LMTCB. Need to know where to send prescription. Have faxed rx twice with confirmation that it went through and have called the pharmacy 3 times with no success of getting someone on the phone to speak with them.

## 2020-09-21 NOTE — Telephone Encounter (Signed)
Noted  

## 2020-09-25 ENCOUNTER — Other Ambulatory Visit: Payer: Self-pay | Admitting: Internal Medicine

## 2020-09-30 NOTE — Assessment & Plan Note (Signed)
Low cholesterol diet and exercise.  Follow lipid panel.   

## 2020-09-30 NOTE — Assessment & Plan Note (Signed)
S/p ablation 08/2019.  Persistent intermittent afib/aflutter.  No significant problem with increased heart rate or palpitations now.  Continue eliquis and diltiazem.  Follow.

## 2021-01-12 ENCOUNTER — Other Ambulatory Visit: Payer: Self-pay | Admitting: Internal Medicine

## 2021-01-17 ENCOUNTER — Encounter: Payer: Medicare HMO | Admitting: Internal Medicine

## 2021-02-01 ENCOUNTER — Ambulatory Visit (INDEPENDENT_AMBULATORY_CARE_PROVIDER_SITE_OTHER): Payer: Medicare HMO

## 2021-02-01 VITALS — Ht 68.0 in | Wt 208.0 lb

## 2021-02-01 DIAGNOSIS — Z0000 Encounter for general adult medical examination without abnormal findings: Secondary | ICD-10-CM

## 2021-02-01 DIAGNOSIS — Z1231 Encounter for screening mammogram for malignant neoplasm of breast: Secondary | ICD-10-CM
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
## 2021-02-01 NOTE — Patient Instructions (Addendum)
Caitlin [name] , Thank you for taking time to come for your Medicare Wellness Visit. I appreciate your ongoing commitment to your health goals. Please review the following plan we discussed and let me know if I can assist you in the future.   These are the goals we discussed:  Goals       Patient Stated     Maintain Healthy Lifestyle (pt-stated)      Stay active Stay hydrated         This is a list of the screening recommended for you and due dates:  Health Maintenance  Topic Date Due   COVID-19 Vaccine (3 - Moderna risk series) 02/17/2021*   Zoster (Shingles) Vaccine (1 of 2) 05/02/2021*   Flu Shot  05/31/2021*   DEXA scan (bone density measurement)  06/01/2021*   Pneumonia Vaccine (1 - PCV) 02/01/2022*   Tetanus Vaccine  02/01/2022*   Mammogram  04/02/2022   Colon Cancer Screening  02/09/2025   Hepatitis C Screening: USPSTF Recommendation to screen - Ages 18-79 yo.  Completed   HPV Vaccine  Aged Out  *Topic was postponed. The date shown is not the original due date.   Advanced directives: not yet completed  Conditions/risks identified: none new  Follow up in one year for your annual wellness visit    Preventive Care 65 Years and Older, Female Preventive care refers to lifestyle choices and visits with your health care provider that can promote health and wellness. What does preventive care include? A yearly physical exam. This is also called an annual well check. Dental exams once or twice a year. Routine eye exams. Ask your health care provider how often you should have your eyes checked. Personal lifestyle choices, including: Daily care of your teeth and gums. Regular physical activity. Eating a healthy diet. Avoiding tobacco and drug use. Limiting alcohol use. Practicing safe sex. Taking low-dose aspirin every day. Taking vitamin and mineral supplements as recommended by your health care provider. What happens during an annual well check? The services and  screenings done by your health care provider during your annual well check will depend on your age, overall health, lifestyle risk factors, and family history of disease. Counseling  Your health care provider may ask you questions about your: Alcohol use. Tobacco use. Drug use. Emotional well-being. Home and relationship well-being. Sexual activity. Eating habits. History of falls. Memory and ability to understand (cognition). Work and work Astronomer. Reproductive health. Screening  You may have the following tests or measurements: Height, weight, and BMI. Blood pressure. Lipid and cholesterol levels. These may be checked every 5 years, or more frequently if you are over 59 years old. Skin check. Lung cancer screening. You may have this screening every year starting at age 31 if you have a 30-pack-year history of smoking and currently smoke or have quit within the past 15 years. Fecal occult blood test (FOBT) of the stool. You may have this test every year starting at age 5. Flexible sigmoidoscopy or colonoscopy. You may have a sigmoidoscopy every 5 years or a colonoscopy every 10 years starting at age 75. Hepatitis C blood test. Hepatitis B blood test. Sexually transmitted disease (STD) testing. Diabetes screening. This is done by checking your blood sugar (glucose) after you have not eaten for a while (fasting). You may have this done every 1-3 years. Bone density scan. This is done to screen for osteoporosis. You may have this done starting at age 23. Mammogram. This may be done every 1-2  years. Talk to your health care provider about how often you should have regular mammograms. Talk with your health care provider about your test results, treatment options, and if necessary, the need for more tests. Vaccines  Your health care provider may recommend certain vaccines, such as: Influenza vaccine. This is recommended every year. Tetanus, diphtheria, and acellular pertussis (Tdap,  Td) vaccine. You may need a Td booster every 10 years. Zoster vaccine. You may need this after age 38. Pneumococcal 13-valent conjugate (PCV13) vaccine. One dose is recommended after age 80. Pneumococcal polysaccharide (PPSV23) vaccine. One dose is recommended after age 37. Talk to your health care provider about which screenings and vaccines you need and how often you need them. This information is not intended to replace advice given to you by your health care provider. Make sure you discuss any questions you have with your health care provider. Document Released: 03/16/2015 Document Revised: 11/07/2015 Document Reviewed: 12/19/2014 Elsevier Interactive Patient Education  2017 [place] Prevention in the Home Falls can cause injuries. They can happen to people of all ages. There are many things you can do to make your home safe and to help prevent falls. What can I do on the outside of my home? Regularly fix the edges of walkways and driveways and fix any cracks. Remove anything that might make you trip as you walk through a door, such as a raised step or threshold. Trim any bushes or trees on the path to your home. Use bright outdoor lighting. Clear any walking paths of anything that might make someone trip, such as rocks or tools. Regularly check to see if handrails are loose or broken. Make sure that both sides of any steps have handrails. Any raised decks and porches should have guardrails on the edges. Have any leaves, snow, or ice cleared regularly. Use sand or salt on walking paths during winter. Clean up any spills in your garage right away. This includes oil or grease spills. What can I do in the bathroom? Use night lights. Install grab bars by the toilet and in the tub and shower. Do not use towel bars as grab bars. Use non-skid mats or decals in the tub or shower. If you need to sit down in the shower, use a plastic, non-slip stool. Keep the floor dry. Clean up any  water that spills on the floor as soon as it happens. Remove soap buildup in the tub or shower regularly. Attach bath mats securely with double-sided non-slip rug tape. Do not have throw rugs and other things on the floor that can make you trip. What can I do in the bedroom? Use night lights. Make sure that you have a light by your bed that is easy to reach. Do not use any sheets or blankets that are too big for your bed. They should not hang down onto the floor. Have a firm chair that has side arms. You can use this for support while you get dressed. Do not have throw rugs and other things on the floor that can make you trip. What can I do in the kitchen? Clean up any spills right away. Avoid walking on wet floors. Keep items that you use a lot in easy-to-reach places. If you need to reach something above you, use a strong step stool that has a grab bar. Keep electrical cords out of the way. Do not use floor polish or wax that makes floors slippery. If you must use wax, use non-skid floor  wax. Do not have throw rugs and other things on the floor that can make you trip. What can I do with my stairs? Do not leave any items on the stairs. Make sure that there are handrails on both sides of the stairs and use them. Fix handrails that are broken or loose. Make sure that handrails are as long as the stairways. Check any carpeting to make sure that it is firmly attached to the stairs. Fix any carpet that is loose or worn. Avoid having throw rugs at the top or bottom of the stairs. If you do have throw rugs, attach them to the floor with carpet tape. Make sure that you have a light switch at the top of the stairs and the bottom of the stairs. If you do not have them, ask someone to add them for you. What else can I do to help prevent falls? Wear shoes that: Do not have high heels. Have rubber bottoms. Are comfortable and fit you well. Are closed at the toe. Do not wear sandals. If you use a  stepladder: Make sure that it is fully opened. Do not climb a closed stepladder. Make sure that both sides of the stepladder are locked into place. Ask someone to hold it for you, if possible. Clearly mark and make sure that you can see: Any grab bars or handrails. First and last steps. Where the edge of each step is. Use tools that help you move around (mobility aids) if they are needed. These include: Canes. Walkers. Scooters. Crutches. Turn on the lights when you go into a [place] area. Replace any light bulbs as soon as they burn out. Set up your furniture so you have a clear path. Avoid moving your furniture around. If any of your floors are uneven, fix them. If there are any pets around you, be aware of where they are. Review your medicines with your doctor. Some medicines can make you feel dizzy. This can increase your chance of falling. Ask your doctor what other things that you can do to help prevent falls. This information is not intended to replace advice given to you by your health care provider. Make sure you discuss any questions you have with your health care provider. Document Released: 12/14/2008 Document Revised: 07/26/2015 Document Reviewed: 03/24/2014 Elsevier Interactive Patient Education  2017 [employer].

## 2021-02-01 NOTE — Addendum Note (Signed)
Addended by: Varney Biles on: 02/01/2021 10:50 AM   Modules accepted: Orders

## 2021-02-07 ENCOUNTER — Telehealth: Payer: Self-pay | Admitting: Internal Medicine

## 2021-02-07 NOTE — Telephone Encounter (Signed)
Having pain in the middle of back from right side to from mid back to side, back pain rated at 6. Patient has had flu symptoms  X 1 week has had tamiflu, coughing on phone. Patient had course of prednisone and ibuprofen used ice. Patient says she feels better since having started the prednisone. Advised patient that she should be evaluated to make sure having muscle pain versus developing a pneumonia from the flu , patient says she is 1.5  hours say cannot come here she will call schedule appointment with UC. Patient refused UC to night will go tomorrow.

## 2021-04-13 ENCOUNTER — Other Ambulatory Visit: Payer: Self-pay | Admitting: Internal Medicine

## 2021-06-27 ENCOUNTER — Ambulatory Visit (INDEPENDENT_AMBULATORY_CARE_PROVIDER_SITE_OTHER): Payer: Medicare HMO | Admitting: Internal Medicine

## 2021-06-27 DIAGNOSIS — I4891 Unspecified atrial fibrillation: Secondary | ICD-10-CM

## 2021-06-27 DIAGNOSIS — E7800 Pure hypercholesterolemia, unspecified: Secondary | ICD-10-CM

## 2021-07-29 ENCOUNTER — Encounter: Payer: Self-pay | Admitting: Internal Medicine

## 2021-07-29 NOTE — Progress Notes (Signed)
Was not seen.

## 2021-08-20 ENCOUNTER — Other Ambulatory Visit: Payer: Self-pay | Admitting: Internal Medicine

## 2021-08-21 ENCOUNTER — Encounter: Payer: Self-pay | Admitting: Internal Medicine

## 2021-08-21 MED ORDER — FLUOXETINE HCL 20 MG PO CAPS: 20.0000 mg | ORAL_CAPSULE | Freq: Every day | ORAL | 0 refills | Status: DC

## 2021-09-11 ENCOUNTER — Encounter: Payer: Medicare HMO | Admitting: Internal Medicine

## 2021-10-15 ENCOUNTER — Encounter: Payer: Medicare HMO | Admitting: Internal Medicine

## 2021-10-16 ENCOUNTER — Encounter: Payer: Medicare HMO | Admitting: Internal Medicine

## 2021-11-12 ENCOUNTER — Ambulatory Visit (INDEPENDENT_AMBULATORY_CARE_PROVIDER_SITE_OTHER): Payer: Medicare HMO | Admitting: Internal Medicine

## 2021-11-12 ENCOUNTER — Encounter: Payer: Self-pay | Admitting: Internal Medicine

## 2021-11-12 VITALS — BP 120/70 | HR 64 | Temp 98.7°F | Resp 14 | Ht 68.0 in | Wt 191.4 lb

## 2021-11-12 DIAGNOSIS — F32A Depression, unspecified: Secondary | ICD-10-CM

## 2021-11-12 DIAGNOSIS — Z23 Encounter for immunization: Secondary | ICD-10-CM

## 2021-11-12 DIAGNOSIS — R6884 Jaw pain: Secondary | ICD-10-CM

## 2021-11-12 DIAGNOSIS — Z0000 Encounter for general adult medical examination without abnormal findings: Secondary | ICD-10-CM

## 2021-11-12 DIAGNOSIS — E7800 Pure hypercholesterolemia, unspecified: Secondary | ICD-10-CM

## 2021-11-12 DIAGNOSIS — I484 Atypical atrial flutter: Secondary | ICD-10-CM

## 2021-11-12 DIAGNOSIS — I4891 Unspecified atrial fibrillation: Secondary | ICD-10-CM

## 2021-11-12 DIAGNOSIS — K760 Fatty (change of) liver, not elsewhere classified: Secondary | ICD-10-CM

## 2021-11-12 LAB — CBC WITH DIFFERENTIAL/PLATELET
Basophils Absolute: 0.1 10*3/uL (ref 0.0–0.1)
Basophils Relative: 1.2 % (ref 0.0–3.0)
Eosinophils Absolute: 0.1 10*3/uL (ref 0.0–0.7)
Eosinophils Relative: 2.4 % (ref 0.0–5.0)
HCT: 40.3 % (ref 36.0–46.0)
Hemoglobin: 13.1 g/dL (ref 12.0–15.0)
Lymphocytes Relative: 24.1 % (ref 12.0–46.0)
Lymphs Abs: 1.5 10*3/uL (ref 0.7–4.0)
MCHC: 32.6 g/dL (ref 30.0–36.0)
MCV: 87 fl (ref 78.0–100.0)
Monocytes Absolute: 0.6 10*3/uL (ref 0.1–1.0)
Monocytes Relative: 9.9 % (ref 3.0–12.0)
Neutro Abs: 3.8 10*3/uL (ref 1.4–7.7)
Neutrophils Relative %: 62.4 % (ref 43.0–77.0)
Platelets: 286 10*3/uL (ref 150.0–400.0)
RBC: 4.63 Mil/uL (ref 3.87–5.11)
RDW: 13.9 % (ref 11.5–15.5)
WBC: 6.1 10*3/uL (ref 4.0–10.5)

## 2021-11-12 LAB — BASIC METABOLIC PANEL
BUN: 12 mg/dL (ref 6–23)
CO2: 26 mEq/L (ref 19–32)
Calcium: 9.7 mg/dL (ref 8.4–10.5)
Chloride: 101 mEq/L (ref 96–112)
Creatinine, Ser: 0.63 mg/dL (ref 0.40–1.20)
GFR: 91.25 mL/min (ref >60.00)
Glucose, Bld: 87 mg/dL (ref 70–99)
Potassium: 4.8 mEq/L (ref 3.5–5.1)
Sodium: 135 mEq/L (ref 135–145)

## 2021-11-12 LAB — HEPATIC FUNCTION PANEL
ALT: 13 U/L (ref 0–35)
AST: 19 U/L (ref 0–37)
Albumin: 4.2 g/dL (ref 3.5–5.2)
Alkaline Phosphatase: 96 U/L (ref 39–117)
Bilirubin, Direct: 0.1 mg/dL (ref 0.0–0.3)
Total Bilirubin: 0.5 mg/dL (ref 0.2–1.2)
Total Protein: 7 g/dL (ref 6.0–8.3)

## 2021-11-12 LAB — LIPID PANEL
Cholesterol: 196 mg/dL (ref 0–200)
HDL: 83.7 mg/dL (ref >39.00)
LDL Cholesterol: 93 mg/dL (ref 0–99)
NonHDL: 111.84
Total CHOL/HDL Ratio: 2
Triglycerides: 95 mg/dL (ref 0.0–149.0)
VLDL: 19 mg/dL (ref 0.0–40.0)

## 2021-11-12 LAB — TSH: TSH: 1.25 u[IU]/mL (ref 0.35–5.50)

## 2021-11-12 LAB — SEDIMENTATION RATE: Sed Rate: 26 mm/hr (ref 0–30)
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
## 2021-11-12 NOTE — Assessment & Plan Note (Addendum)
Physical today 11/12/21.  Colonoscopy 01/2014.  Overdue.  Mammogram 04/03/20 - birads I. Mammogram - [facility].

## 2021-11-16 ENCOUNTER — Other Ambulatory Visit: Payer: Self-pay | Admitting: Internal Medicine

## 2021-11-18 ENCOUNTER — Encounter: Payer: Self-pay | Admitting: Internal Medicine

## 2021-11-18 NOTE — Assessment & Plan Note (Signed)
Previous ultrasound - revealed fatty liver.  Diet and exercise.  Follow lipid panel.

## 2021-11-18 NOTE — Assessment & Plan Note (Signed)
Sees cardiology for f/u afib/aflutter s/p ablation on dofetilide and eliquis. Stable.   

## 2021-11-18 NOTE — Assessment & Plan Note (Signed)
Low cholesterol diet and exercise.  Follow lipid panel.   

## 2021-11-18 NOTE — Assessment & Plan Note (Signed)
Symptom as outlined.  Has seen ENT.  Uses ice pack.  Takes occasional ibuprofen.  Discussed avoiding antiinflammatory medication given on eliquis.  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  May need referral to a specialist.

## 2021-11-18 NOTE — Assessment & Plan Note (Signed)
Continue prozac.  Stable.  

## 2021-11-20 ENCOUNTER — Telehealth: Payer: Self-pay | Admitting: Internal Medicine

## 2021-11-20 NOTE — Telephone Encounter (Signed)
I had told her I would check and see who was recommended for TMJ treatment, etc.  Notify Caitlin [name] that Dr Augustina Mood with [facility] is who is recommended.  If wants to call and schedule an appt - phone [phone].  Address [address], [place], [place] [address].  Also, she was to let me know where to send mammogram order.  Need fax number.  Has she made an appt.  (She lives in [place])

## 2021-11-20 NOTE — Telephone Encounter (Signed)
I called patient & gave her info to be able to reach Dr. Corky Sing office. She also stated that she would have to let us know where she wanted mammogram order sent a she was not yet sure. She will call back to let us know.

## 2022-02-04 ENCOUNTER — Ambulatory Visit (INDEPENDENT_AMBULATORY_CARE_PROVIDER_SITE_OTHER): Payer: Medicare HMO

## 2022-02-04 VITALS — Ht 68.0 in | Wt 191.0 lb

## 2022-02-04 DIAGNOSIS — Z0000 Encounter for general adult medical examination without abnormal findings: Secondary | ICD-10-CM | POA: Diagnosis not present
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
## 2022-02-04 NOTE — Patient Instructions (Addendum)
Caitlin [name] , Thank you for taking time to come for your Medicare Wellness Visit. I appreciate your ongoing commitment to your health goals. Please review the following plan we discussed and let me know if I can assist you in the future.   These are the goals we discussed:  Goals       Patient Stated     Maintain Healthy Lifestyle (pt-stated)      Stay active Stay hydrated Healthy diet         This is a list of the screening recommended for you and due dates:  Health Maintenance  Topic Date Due   DTaP/Tdap/Td vaccine (1 - Tdap) Never done   COVID-19 Vaccine (3 - Moderna risk series) 02/20/2022*   DEXA scan (bone density measurement)  04/03/2022*   Zoster (Shingles) Vaccine (1 of 2) 05/06/2022*   Pneumonia Vaccine (1 - PCV) 02/05/2023*   Mammogram  04/02/2022   Medicare Annual Wellness Visit  02/05/2023   Colon Cancer Screening  02/09/2025   Flu Shot  Completed   Hepatitis C Screening: USPSTF Recommendation to screen - Ages 18-79 yo.  Completed   HPV Vaccine  Aged Out  *Topic was postponed. The date shown is not the original due date.    Advanced directives: End of life planning; Advance aging; Advanced directives discussed.  Plans to revise current HCPOA/Living Will and update medical record upon completion.  Conditions/risks identified: none at this time.   Next appointment: Follow up in one year for your annual wellness visit.   Preventive Care 38 Years and Older, Female Preventive care refers to lifestyle choices and visits with your health care provider that can promote health and wellness. What does preventive care include? A yearly physical exam. This is also called an annual well check. Dental exams once or twice a year. Routine eye exams. Ask your health care provider how often you should have your eyes checked. Personal lifestyle choices, including: Daily care of your teeth and gums. Regular physical activity. Eating a healthy diet. Avoiding tobacco and drug  use. Limiting alcohol use. Practicing safe sex. Taking low-dose aspirin every day. Taking vitamin and mineral supplements as recommended by your health care provider. What happens during an annual well check? The services and screenings done by your health care provider during your annual well check will depend on your age, overall health, lifestyle risk factors, and family history of disease. Counseling  Your health care provider may ask you questions about your: Alcohol use. Tobacco use. Drug use. Emotional well-being. Home and relationship well-being. Sexual activity. Eating habits. History of falls. Memory and ability to understand (cognition). Work and work Astronomer. Reproductive health. Screening  You may have the following tests or measurements: Height, weight, and BMI. Blood pressure. Lipid and cholesterol levels. These may be checked every 5 years, or more frequently if you are over 11 years old. Skin check. Lung cancer screening. You may have this screening every year starting at age 48 if you have a 30-pack-year history of smoking and currently smoke or have quit within the past 15 years. Fecal occult blood test (FOBT) of the stool. You may have this test every year starting at age 2. Flexible sigmoidoscopy or colonoscopy. You may have a sigmoidoscopy every 5 years or a colonoscopy every 10 years starting at age 55. Hepatitis C blood test. Hepatitis B blood test. Sexually transmitted disease (STD) testing. Diabetes screening. This is done by checking your blood sugar (glucose) after you have not eaten for  a while (fasting). You may have this done every 1-3 years. Bone density scan. This is done to screen for osteoporosis. You may have this done starting at age 17. Mammogram. This may be done every 1-2 years. Talk to your health care provider about how often you should have regular mammograms. Talk with your health care provider about your test results, treatment  options, and if necessary, the need for more tests. Vaccines  Your health care provider may recommend certain vaccines, such as: Influenza vaccine. This is recommended every year. Tetanus, diphtheria, and acellular pertussis (Tdap, Td) vaccine. You may need a Td booster every 10 years. Zoster vaccine. You may need this after age 42. Pneumococcal 13-valent conjugate (PCV13) vaccine. One dose is recommended after age 26. Pneumococcal polysaccharide (PPSV23) vaccine. One dose is recommended after age 70. Talk to your health care provider about which screenings and vaccines you need and how often you need them. This information is not intended to replace advice given to you by your health care provider. Make sure you discuss any questions you have with your health care provider. Document Released: 03/16/2015 Document Revised: 11/07/2015 Document Reviewed: 12/19/2014 Elsevier Interactive Patient Education  2017 [place] Prevention in the Home Falls can cause injuries. They can happen to people of all ages. There are many things you can do to make your home safe and to help prevent falls. What can I do on the outside of my home? Regularly fix the edges of walkways and driveways and fix any cracks. Remove anything that might make you trip as you walk through a door, such as a raised step or threshold. Trim any bushes or trees on the path to your home. Use bright outdoor lighting. Clear any walking paths of anything that might make someone trip, such as rocks or tools. Regularly check to see if handrails are loose or broken. Make sure that both sides of any steps have handrails. Any raised decks and porches should have guardrails on the edges. Have any leaves, snow, or ice cleared regularly. Use sand or salt on walking paths during winter. Clean up any spills in your garage right away. This includes oil or grease spills. What can I do in the bathroom? Use night lights. Install grab  bars by the toilet and in the tub and shower. Do not use towel bars as grab bars. Use non-skid mats or decals in the tub or shower. If you need to sit down in the shower, use a plastic, non-slip stool. Keep the floor dry. Clean up any water that spills on the floor as soon as it happens. Remove soap buildup in the tub or shower regularly. Attach bath mats securely with double-sided non-slip rug tape. Do not have throw rugs and other things on the floor that can make you trip. What can I do in the bedroom? Use night lights. Make sure that you have a light by your bed that is easy to reach. Do not use any sheets or blankets that are too big for your bed. They should not hang down onto the floor. Have a firm chair that has side arms. You can use this for support while you get dressed. Do not have throw rugs and other things on the floor that can make you trip. What can I do in the kitchen? Clean up any spills right away. Avoid walking on wet floors. Keep items that you use a lot in easy-to-reach places. If you need to reach something above  you, use a strong step stool that has a grab bar. Keep electrical cords out of the way. Do not use floor polish or wax that makes floors slippery. If you must use wax, use non-skid floor wax. Do not have throw rugs and other things on the floor that can make you trip. What can I do with my stairs? Do not leave any items on the stairs. Make sure that there are handrails on both sides of the stairs and use them. Fix handrails that are broken or loose. Make sure that handrails are as long as the stairways. Check any carpeting to make sure that it is firmly attached to the stairs. Fix any carpet that is loose or worn. Avoid having throw rugs at the top or bottom of the stairs. If you do have throw rugs, attach them to the floor with carpet tape. Make sure that you have a light switch at the top of the stairs and the bottom of the stairs. If you do not have them,  ask someone to add them for you. What else can I do to help prevent falls? Wear shoes that: Do not have high heels. Have rubber bottoms. Are comfortable and fit you well. Are closed at the toe. Do not wear sandals. If you use a stepladder: Make sure that it is fully opened. Do not climb a closed stepladder. Make sure that both sides of the stepladder are locked into place. Ask someone to hold it for you, if possible. Clearly mark and make sure that you can see: Any grab bars or handrails. First and last steps. Where the edge of each step is. Use tools that help you move around (mobility aids) if they are needed. These include: Canes. Walkers. Scooters. Crutches. Turn on the lights when you go into a dark area. Replace any light bulbs as soon as they burn out. Set up your furniture so you have a clear path. Avoid moving your furniture around. If any of your floors are uneven, fix them. If there are any pets around you, be aware of where they are. Review your medicines with your doctor. Some medicines can make you feel dizzy. This can increase your chance of falling. Ask your doctor what other things that you can do to help prevent falls. This information is not intended to replace advice given to you by your health care provider. Make sure you discuss any questions you have with your health care provider. Document Released: 12/14/2008 Document Revised: 07/26/2015 Document Reviewed: 03/24/2014 Elsevier Interactive Patient Education  2017 [employer].

## 2022-02-12 ENCOUNTER — Ambulatory Visit: Payer: Medicare HMO | Admitting: Internal Medicine

## 2022-04-11 ENCOUNTER — Ambulatory Visit (INDEPENDENT_AMBULATORY_CARE_PROVIDER_SITE_OTHER): Payer: Medicare HMO | Admitting: Internal Medicine

## 2022-04-11 ENCOUNTER — Encounter: Payer: Self-pay | Admitting: Internal Medicine

## 2022-04-11 VITALS — BP 126/74 | HR 80 | Ht 67.5 in | Wt 195.6 lb

## 2022-04-11 DIAGNOSIS — Z1231 Encounter for screening mammogram for malignant neoplasm of breast: Secondary | ICD-10-CM

## 2022-04-11 DIAGNOSIS — E7800 Pure hypercholesterolemia, unspecified: Secondary | ICD-10-CM | POA: Diagnosis not present

## 2022-04-11 DIAGNOSIS — R079 Chest pain, unspecified: Secondary | ICD-10-CM

## 2022-04-11 DIAGNOSIS — I4891 Unspecified atrial fibrillation: Secondary | ICD-10-CM | POA: Diagnosis not present

## 2022-04-11 DIAGNOSIS — I484 Atypical atrial flutter: Secondary | ICD-10-CM

## 2022-04-11 DIAGNOSIS — K5730 Diverticulosis of large intestine without perforation or abscess without bleeding: Secondary | ICD-10-CM

## 2022-04-11 DIAGNOSIS — F32A Depression, unspecified: Secondary | ICD-10-CM

## 2022-04-11 DIAGNOSIS — K760 Fatty (change of) liver, not elsewhere classified: Secondary | ICD-10-CM

## 2022-04-11 DIAGNOSIS — R6884 Jaw pain: Secondary | ICD-10-CM
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
## 2022-04-12 LAB — LIPID PANEL
Cholesterol: 201 mg/dL — ABNORMAL HIGH (ref <200)
HDL: 73 mg/dL (ref >=50)
LDL Cholesterol (Calc): 103 mg/dL (calc) — ABNORMAL HIGH
Non-HDL Cholesterol (Calc): 128 mg/dL (calc) (ref <130)
Total CHOL/HDL Ratio: 2.8 (calc) (ref <5.0)
Triglycerides: 146 mg/dL (ref <150)

## 2022-04-12 LAB — HEPATIC FUNCTION PANEL
AG Ratio: 1.7 (calc) (ref 1.0–2.5)
ALT: 13 U/L (ref 6–29)
AST: 17 U/L (ref 10–35)
Albumin: 4.3 g/dL (ref 3.6–5.1)
Alkaline phosphatase (APISO): 95 U/L (ref 37–153)
Bilirubin, Direct: 0.1 mg/dL (ref 0.0–0.2)
Globulin: 2.6 g/dL (calc) (ref 1.9–3.7)
Indirect Bilirubin: 0.3 mg/dL (calc) (ref 0.2–1.2)
Total Bilirubin: 0.4 mg/dL (ref 0.2–1.2)
Total Protein: 6.9 g/dL (ref 6.1–8.1)

## 2022-04-12 LAB — BASIC METABOLIC PANEL WITH GFR
BUN: 10 mg/dL (ref 7–25)
CO2: 24 mmol/L (ref 20–32)
Calcium: 9.6 mg/dL (ref 8.6–10.4)
Chloride: 98 mmol/L (ref 98–110)
Creat: 0.57 mg/dL (ref 0.50–1.05)
Glucose, Bld: 95 mg/dL (ref 65–99)
Potassium: 4.8 mmol/L (ref 3.5–5.3)
Sodium: 135 mmol/L (ref 135–146)
eGFR: 99 mL/min/{1.73_m2} (ref >=60)

## 2022-04-13 ENCOUNTER — Encounter: Payer: Self-pay | Admitting: Internal Medicine

## 2022-04-13 NOTE — Assessment & Plan Note (Signed)
Colonoscopy 02/10/20 - diverticulosis.

## 2022-04-13 NOTE — Assessment & Plan Note (Signed)
Sees cardiology for f/u afib/aflutter s/p ablation on dofetilide and eliquis.  Stable. Appears to be in SR today.

## 2022-04-13 NOTE — Assessment & Plan Note (Addendum)
Previous ultrasound - revealed fatty liver.  Diet and exercise.  Follow liver panel.

## 2022-04-13 NOTE — Assessment & Plan Note (Signed)
Having the intermittent chest pain as outlined.  Discussed her intermittent chest pain and need for further evaluation. She has a f/u appt scheduled with cardiology 05/01/22.  Wants to hold on EKG and further testing - until that appt. Continue risk factor modification.  Follow. Discussed any change or worsening symptoms, she is to be evaluated.

## 2022-04-13 NOTE — Assessment & Plan Note (Addendum)
Continue prozac.  Increased stress as outlined.  Overall appears to be handling things relatively well.  Follow.

## 2022-04-13 NOTE — Assessment & Plan Note (Signed)
Improved

## 2022-04-13 NOTE — Assessment & Plan Note (Signed)
Low cholesterol diet and exercise.  Follow lipid panel.   

## 2022-04-13 NOTE — Assessment & Plan Note (Signed)
Sees cardiology for f/u afib/aflutter s/p ablation on dofetilide and eliquis. Stable.

## 2022-04-16 ENCOUNTER — Telehealth: Payer: Self-pay | Admitting: Internal Medicine

## 2022-04-16 NOTE — Telephone Encounter (Signed)
Received notification that Caitlin [name] is overdue mammogram.  Need to schedule.

## 2022-04-16 NOTE — Telephone Encounter (Signed)
Patient gets mammograms in [place]. Order placed. Pt needs to schedule. Discussed at appt recently

## 2022-04-23 NOTE — Telephone Encounter (Signed)
Called patient and reminded to schedule her mammogram.

## 2022-04-30 ENCOUNTER — Encounter: Payer: Self-pay | Admitting: Internal Medicine

## 2022-05-19 ENCOUNTER — Other Ambulatory Visit: Payer: Self-pay | Admitting: Internal Medicine

## 2022-07-02 ENCOUNTER — Ambulatory Visit
Admission: RE | Admit: 2022-07-02 | Discharge: 2022-07-02 | Disposition: A | Discharge status: Home / Self Care | Payer: Medicare HMO | Source: Ambulatory Visit | Attending: Internal Medicine | Admitting: Internal Medicine

## 2022-08-13 ENCOUNTER — Encounter: Payer: Self-pay | Admitting: Internal Medicine

## 2022-08-13 ENCOUNTER — Ambulatory Visit (INDEPENDENT_AMBULATORY_CARE_PROVIDER_SITE_OTHER): Payer: Medicare HMO | Admitting: Internal Medicine

## 2022-08-13 VITALS — BP 118/70 | HR 86 | Temp 98.0°F | Resp 16 | Ht 67.5 in | Wt 202.2 lb

## 2022-08-13 DIAGNOSIS — E7800 Pure hypercholesterolemia, unspecified: Secondary | ICD-10-CM

## 2022-08-13 DIAGNOSIS — F32A Depression, unspecified: Secondary | ICD-10-CM

## 2022-08-13 DIAGNOSIS — R5383 Other fatigue: Secondary | ICD-10-CM

## 2022-08-13 DIAGNOSIS — K760 Fatty (change of) liver, not elsewhere classified: Secondary | ICD-10-CM

## 2022-08-13 DIAGNOSIS — I484 Atypical atrial flutter: Secondary | ICD-10-CM

## 2022-08-13 DIAGNOSIS — M549 Dorsalgia, unspecified: Secondary | ICD-10-CM | POA: Insufficient documentation

## 2022-08-13 DIAGNOSIS — I4891 Unspecified atrial fibrillation: Secondary | ICD-10-CM

## 2022-08-13 DIAGNOSIS — M545 Low back pain, unspecified: Secondary | ICD-10-CM | POA: Diagnosis not present

## 2022-08-13 DIAGNOSIS — K5730 Diverticulosis of large intestine without perforation or abscess without bleeding: Secondary | ICD-10-CM

## 2022-08-13 LAB — CBC WITH DIFFERENTIAL/PLATELET
Basophils Absolute: 0.1 10*3/uL (ref 0.0–0.1)
Basophils Relative: 1.2 % (ref 0.0–3.0)
Eosinophils Absolute: 0.1 10*3/uL (ref 0.0–0.7)
Eosinophils Relative: 1.9 % (ref 0.0–5.0)
HCT: 40.2 % (ref 36.0–46.0)
Hemoglobin: 12.9 g/dL (ref 12.0–15.0)
Lymphocytes Relative: 23.5 % (ref 12.0–46.0)
Lymphs Abs: 1.4 10*3/uL (ref 0.7–4.0)
MCHC: 31.9 g/dL (ref 30.0–36.0)
MCV: 88.1 fl (ref 78.0–100.0)
Monocytes Absolute: 0.6 10*3/uL (ref 0.1–1.0)
Monocytes Relative: 10.7 % (ref 3.0–12.0)
Neutro Abs: 3.8 10*3/uL (ref 1.4–7.7)
Neutrophils Relative %: 62.7 % (ref 43.0–77.0)
Platelets: 312 10*3/uL (ref 150.0–400.0)
RBC: 4.57 Mil/uL (ref 3.87–5.11)
RDW: 14 % (ref 11.5–15.5)
WBC: 6.1 10*3/uL (ref 4.0–10.5)

## 2022-08-13 LAB — BASIC METABOLIC PANEL
BUN: 10 mg/dL (ref 6–23)
CO2: 27 mEq/L (ref 19–32)
Calcium: 9.2 mg/dL (ref 8.4–10.5)
Chloride: 99 mEq/L (ref 96–112)
Creatinine, Ser: 0.65 mg/dL (ref 0.40–1.20)
GFR: 90.09 mL/min (ref >60.00)
Glucose, Bld: 97 mg/dL (ref 70–99)
Potassium: 4.4 mEq/L (ref 3.5–5.1)
Sodium: 131 mEq/L — ABNORMAL LOW (ref 135–145)

## 2022-08-13 LAB — HEPATIC FUNCTION PANEL
ALT: 13 U/L (ref 0–35)
AST: 21 U/L (ref 0–37)
Albumin: 4.3 g/dL (ref 3.5–5.2)
Alkaline Phosphatase: 82 U/L (ref 39–117)
Bilirubin, Direct: 0.1 mg/dL (ref 0.0–0.3)
Total Bilirubin: 0.4 mg/dL (ref 0.2–1.2)
Total Protein: 7.2 g/dL (ref 6.0–8.3)

## 2022-08-13 LAB — LIPID PANEL
Cholesterol: 174 mg/dL (ref 0–200)
HDL: 78.2 mg/dL (ref >39.00)
LDL Cholesterol: 78 mg/dL (ref 0–99)
NonHDL: 95.72
Total CHOL/HDL Ratio: 2
Triglycerides: 90 mg/dL (ref 0.0–149.0)
VLDL: 18 mg/dL (ref 0.0–40.0)

## 2022-08-13 MED ORDER — FLUOXETINE HCL 20 MG PO CAPS: 20.0000 mg | ORAL_CAPSULE | Freq: Every day | ORAL | 1 refills | Status: DC
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
## 2022-08-14 ENCOUNTER — Other Ambulatory Visit: Payer: Self-pay

## 2022-08-14 DIAGNOSIS — E871 Hypo-osmolality and hyponatremia: Secondary | ICD-10-CM

## 2022-08-20 ENCOUNTER — Encounter: Payer: Self-pay | Admitting: Internal Medicine

## 2022-08-20 NOTE — Assessment & Plan Note (Signed)
Sees cardiology for f/u afib/aflutter s/p ablation on dofetilide and eliquis.  Stable. Appears to be in SR today.  

## 2022-08-20 NOTE — Assessment & Plan Note (Signed)
Sees cardiology for f/u afib/aflutter s/p ablation on dofetilide and eliquis. Stable.   

## 2022-08-20 NOTE — Assessment & Plan Note (Signed)
Colonoscopy 02/10/20 - diverticulosis.   

## 2022-08-20 NOTE — Assessment & Plan Note (Signed)
Continue prozac.  Increased stress.  Overall appears to be handling things relatively well.  Follow.

## 2022-08-20 NOTE — Assessment & Plan Note (Signed)
Some fatigue and daytime somnolence.  Discussed.  Discussed further w/up and evaluation for sleep apnea.  She declines.  Discussed not driving if feels sleepy, etc.  Will notify me if changes her mind regarding further w/up and evaluation for sleep apnea.

## 2022-08-20 NOTE — Assessment & Plan Note (Signed)
Previous ultrasound revealed fatty liver.   Diet and exercise.  Follow liver panel.   

## 2022-08-20 NOTE — Assessment & Plan Note (Signed)
Low cholesterol diet and exercise.  Follow lipid panel.   

## 2022-08-20 NOTE — Assessment & Plan Note (Signed)
Persistent low back pain.  Does limit activity.  Discussed PT referral - to evaluate and treat.

## 2022-08-21 IMAGING — MG MM DIGITAL SCREENING BILAT W/ TOMO AND CAD
8 series · 8 of 24 positions shown · non-contrast
Comparison: Previous exam(s).

CLINICAL DATA: Screening.

EXAM:
DIGITAL SCREENING BILATERAL MAMMOGRAM WITH TOMO AND CAD

[R CC synth-2D]
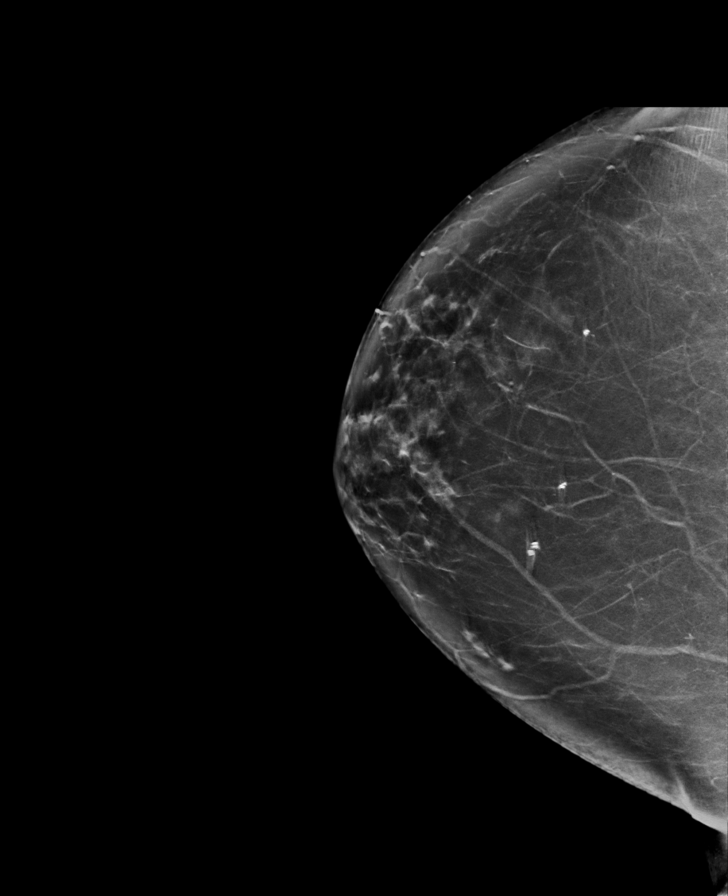

[L CC synth-2D]
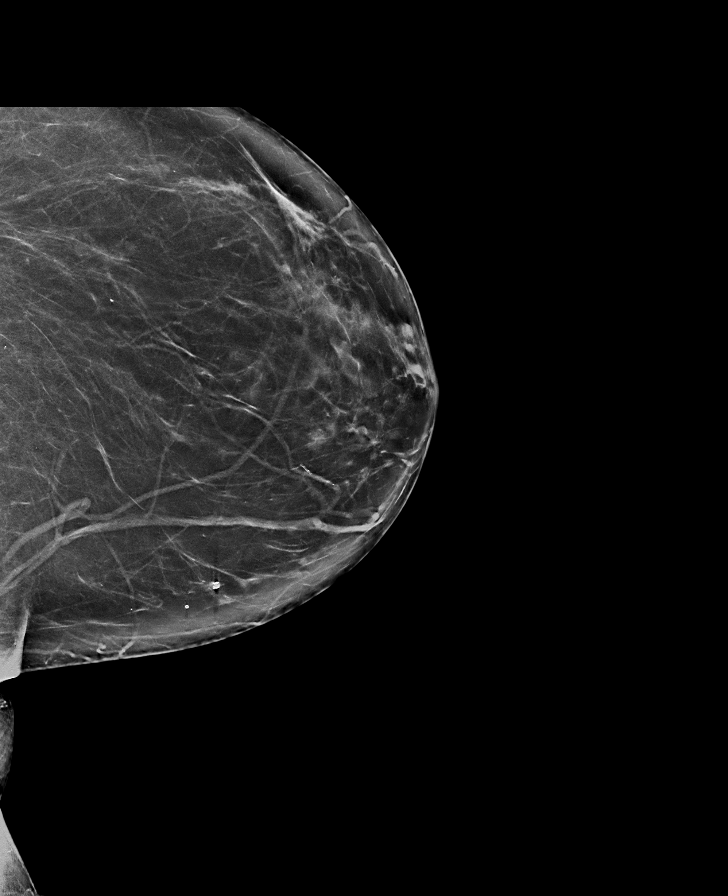

[L MLO synth-2D]
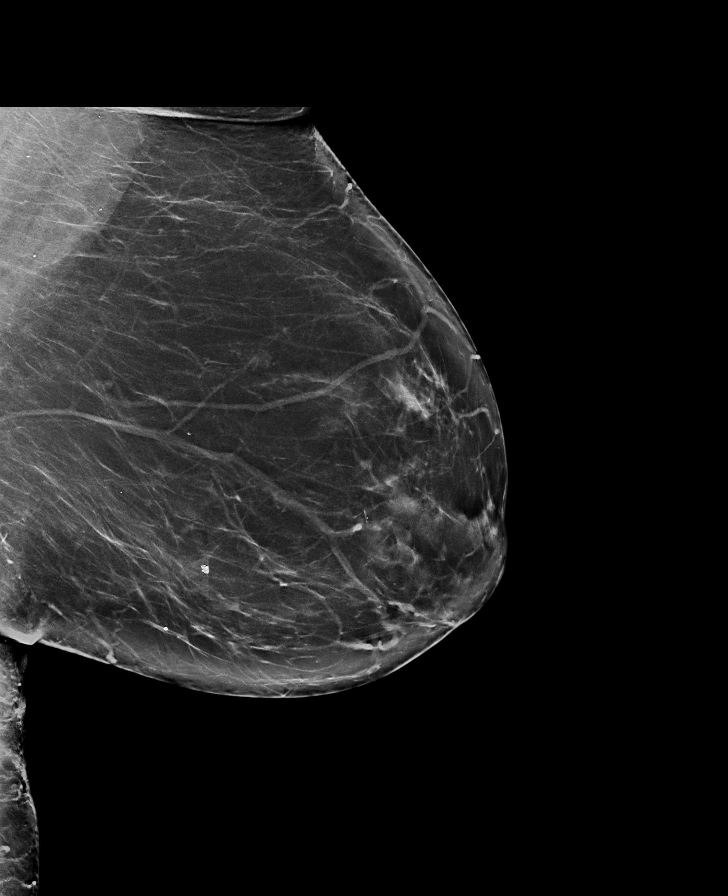

[R MLO synth-2D]
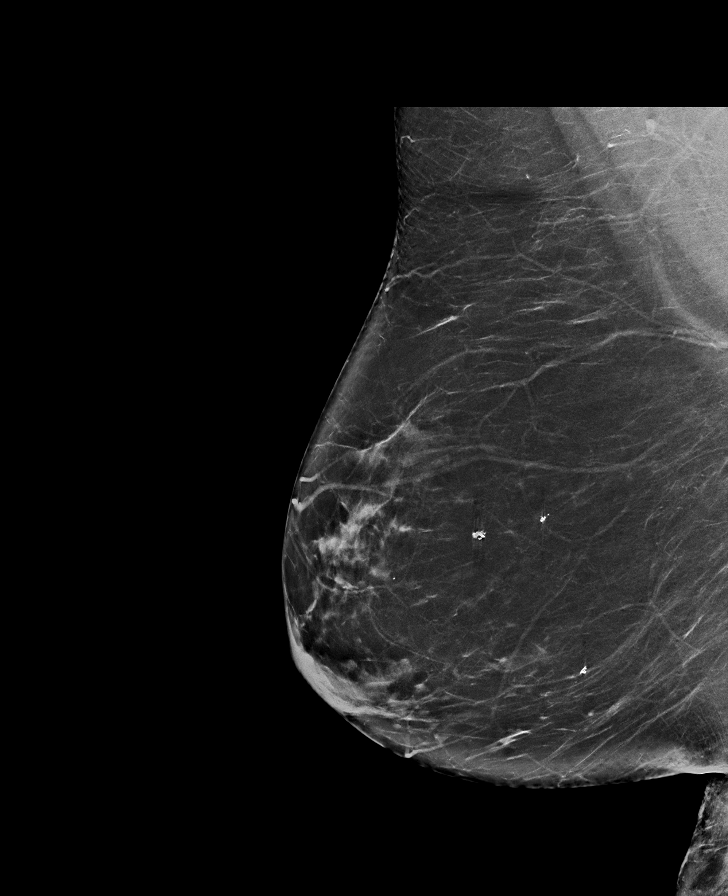

[R CC tomo · tomo slice 45/88.0]
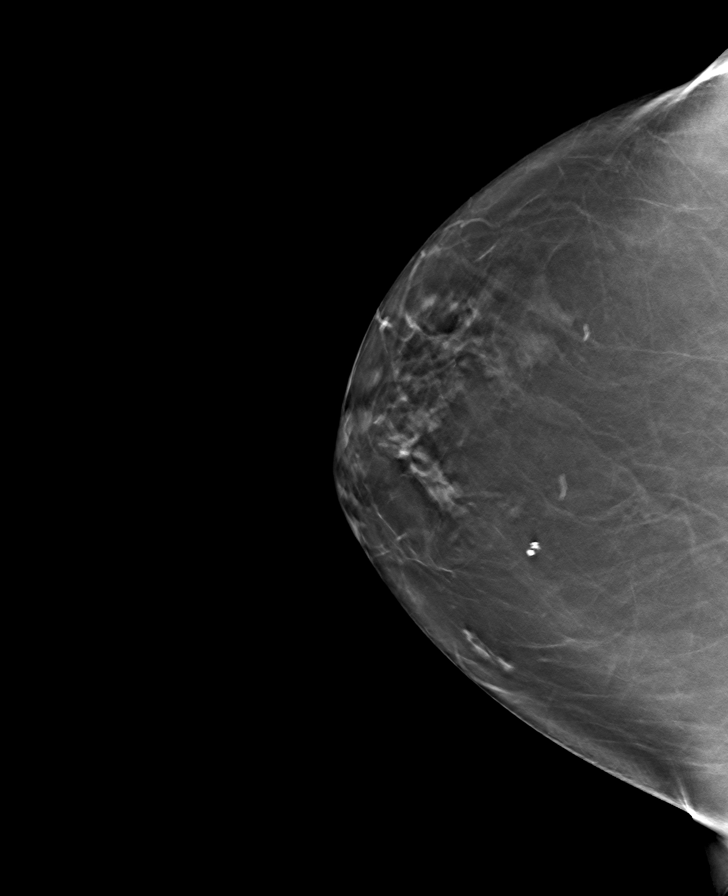

[R MLO tomo · tomo slice 44/87.0]
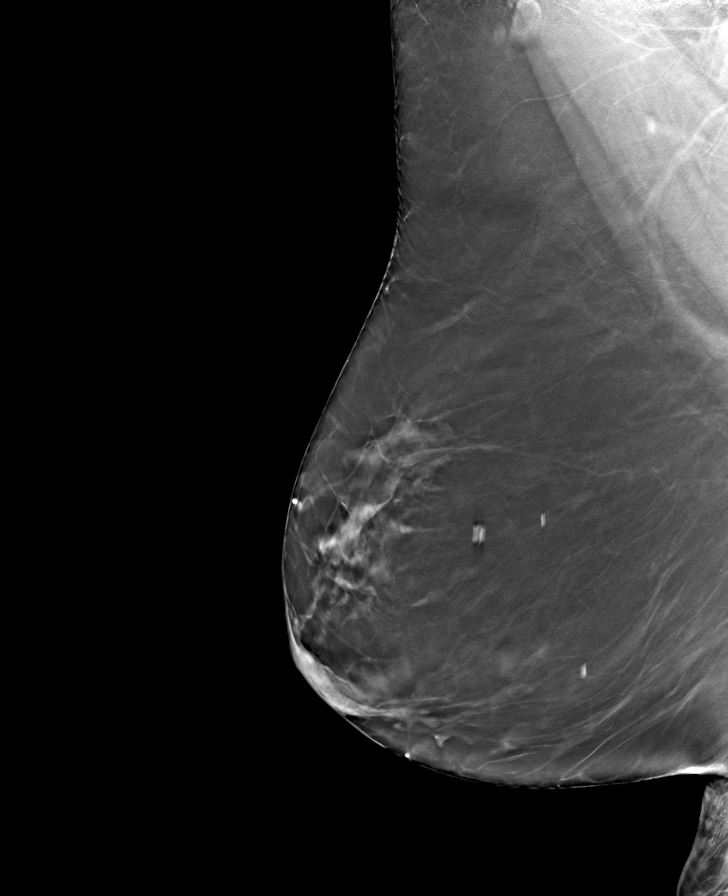

[L CC tomo · tomo slice 41/80.0]
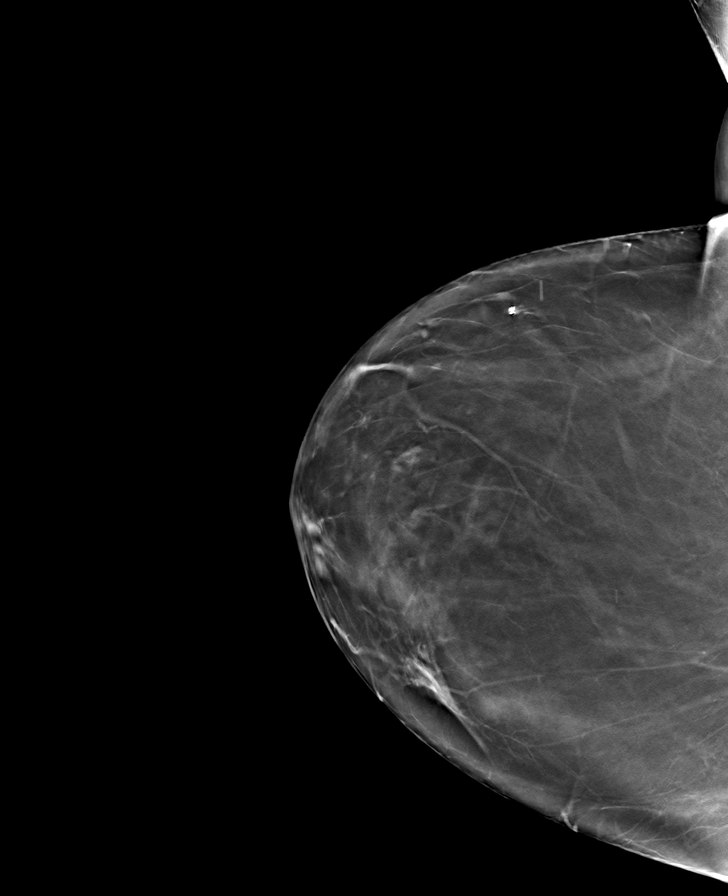

[L MLO tomo · tomo slice 43/85.0]
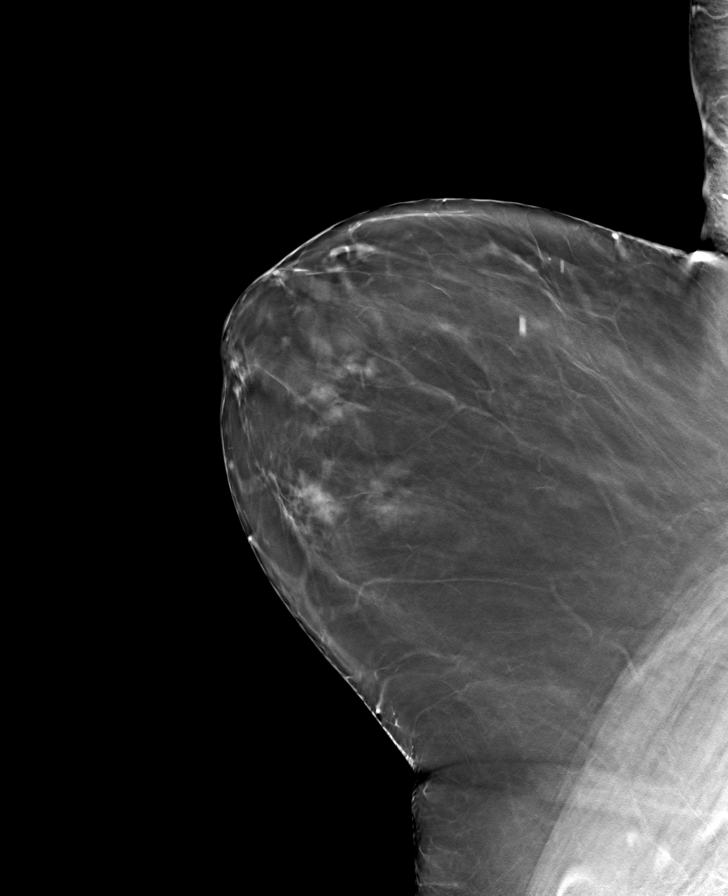

[8 of 24 positions shown; findings below may reference images not displayed]

ACR Breast Density Category b: There are scattered areas of
fibroglandular density.
FINDINGS: There are no findings suspicious for malignancy. The images were
evaluated with computer-aided detection.
IMPRESSION: No mammographic evidence of malignancy. A result letter of this
screening mammogram will be mailed directly to the patient.

RECOMMENDATION:
Screening mammogram in one year. (Code:ZP-7-VX7)

BI-RADS CATEGORY  1: Negative.

## 2022-08-22 ENCOUNTER — Other Ambulatory Visit (INDEPENDENT_AMBULATORY_CARE_PROVIDER_SITE_OTHER): Payer: Medicare HMO

## 2022-08-22 DIAGNOSIS — E871 Hypo-osmolality and hyponatremia: Secondary | ICD-10-CM

## 2022-08-22 NOTE — Addendum Note (Signed)
Addended by: Kadarrius Yanke S on: 08/22/2022 12:24 PM   Modules accepted: Orders  

## 2022-08-23 LAB — SODIUM: Sodium: 135 mmol/L (ref 135–146)

## 2022-12-05 IMAGING — DX DG KNEE 1-2V*R*
2 series · 2 of 2 positions shown · non-contrast
Comparison: None

CLINICAL DATA: Persistent RIGHT knee pain and swelling

EXAM:
RIGHT KNEE - 1-2 VIEW

[knee ap]
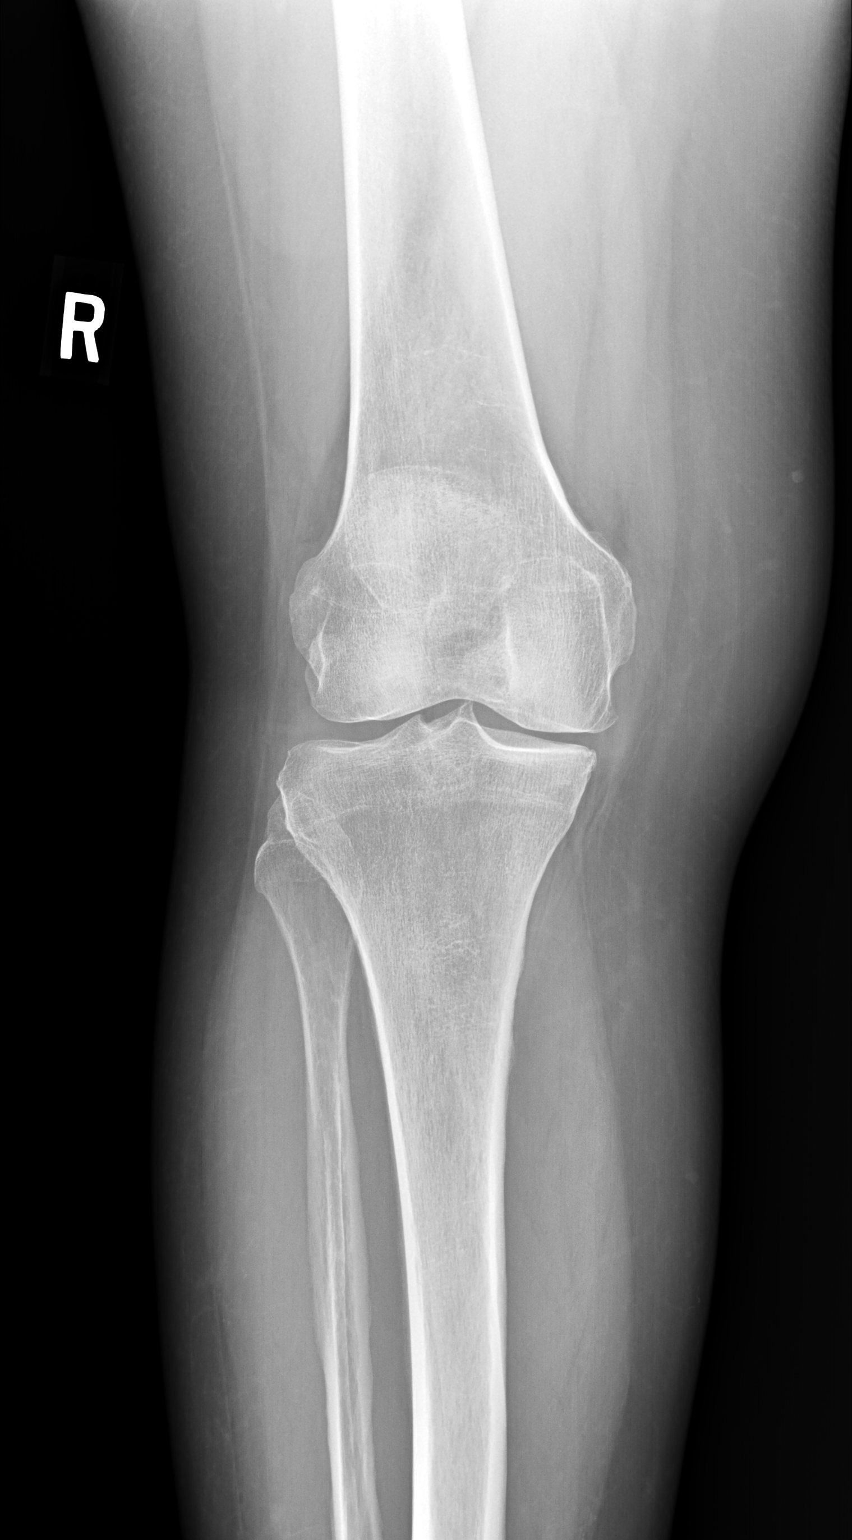

[knee lat]
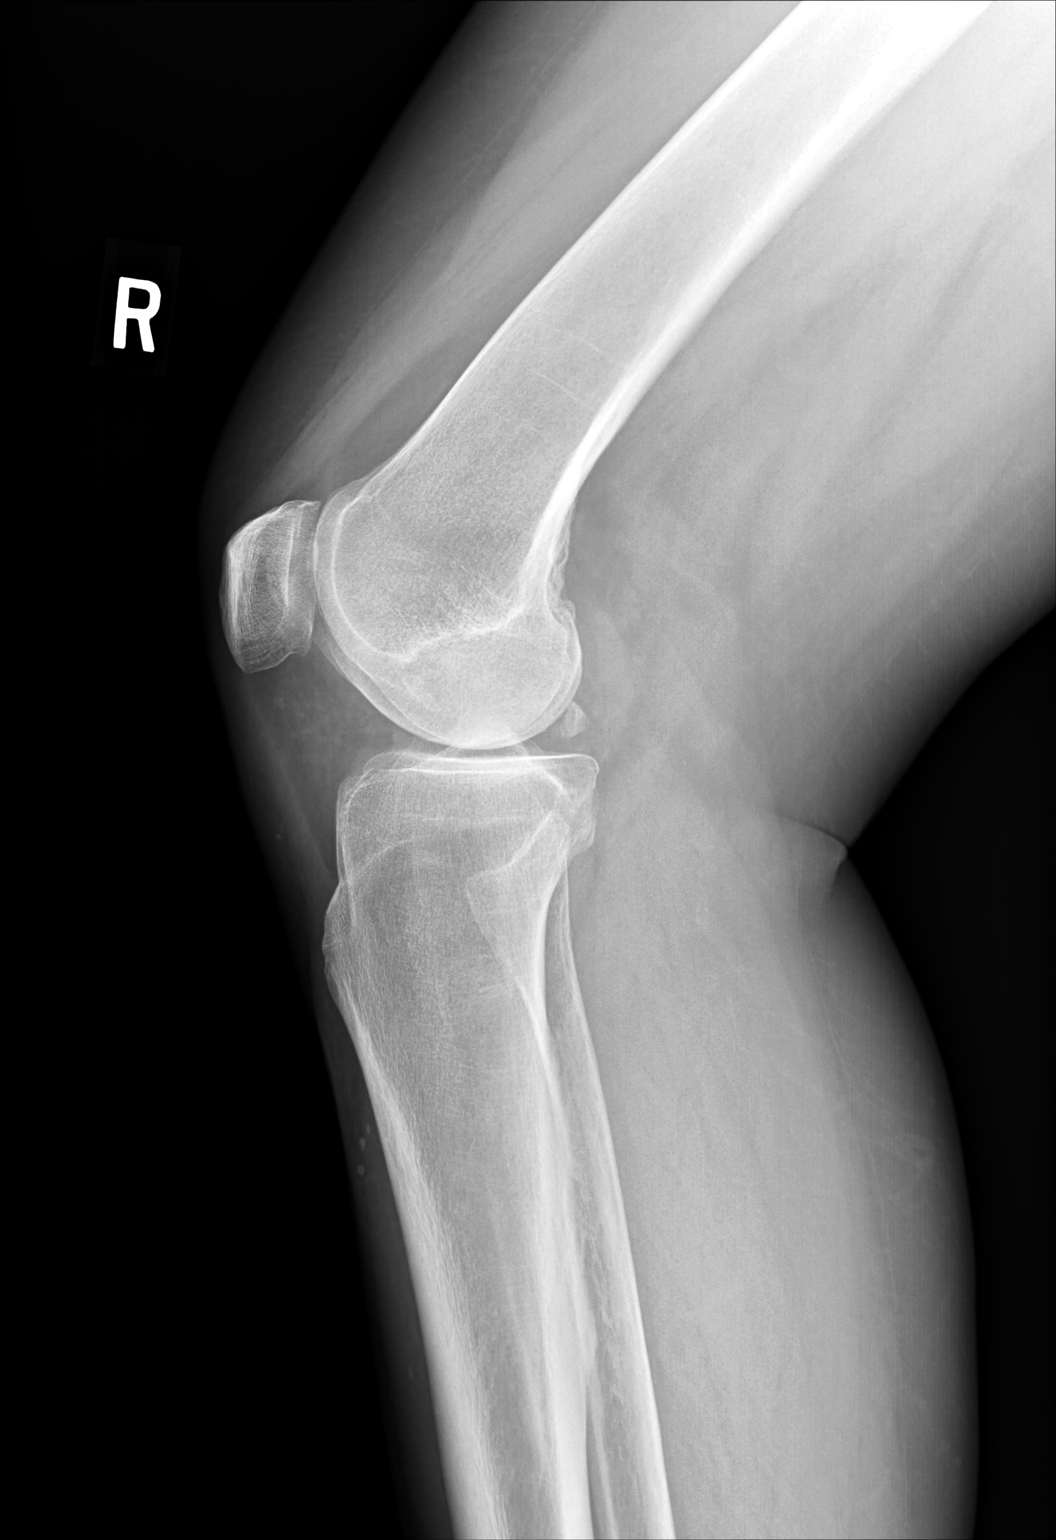

[2 of 2 positions shown; findings below may reference images not displayed]

FINDINGS: Osseous demineralization.

Scattered joint space narrowing.

No fracture, dislocation, or bone destruction.

No joint effusion.
IMPRESSION: Osseous demineralization with mild degenerative changes RIGHT knee.

## 2023-02-11 ENCOUNTER — Ambulatory Visit: Payer: Medicare HMO | Admitting: *Deleted

## 2023-02-11 VITALS — Ht 67.5 in | Wt 195.0 lb

## 2023-02-11 DIAGNOSIS — Z0000 Encounter for general adult medical examination without abnormal findings: Secondary | ICD-10-CM
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
## 2023-02-11 NOTE — Patient Instructions (Signed)
Caitlin [name] , Thank you for taking time to come for your Medicare Wellness Visit. I appreciate your ongoing commitment to your health goals. Please review the following plan we discussed and let me know if I can assist you in the future.   Referrals/Orders/Follow-Ups/Clinician Recommendations: Remember to update your Tetanus (Tdap) and flu vaccines  This is a list of the screening recommended for you and due dates:  Health Maintenance  Topic Date Due   DTaP/Tdap/Td vaccine (1 - Tdap) Never done   Zoster (Shingles) Vaccine (1 of 2) Never done   Pneumonia Vaccine (1 of 1 - PCV) Never done   DEXA scan (bone density measurement)  Never done   COVID-19 Vaccine (3 - Moderna risk series) 11/10/2019   Flu Shot  10/02/2022   Medicare Annual Wellness Visit  02/11/2024   Mammogram  07/01/2024   Colon Cancer Screening  02/09/2025   Hepatitis C Screening  Completed   HPV Vaccine  Aged Out    Advanced directives: (Declined) Advance directive discussed with you today. Even though you declined this today, please call our office should you change your mind, and we can give you the proper paperwork for you to fill out.  Next Medicare Annual Wellness Visit scheduled for next year: Yes 02/15/24 @ 10:10

## 2023-02-12 ENCOUNTER — Encounter: Payer: Medicare HMO | Admitting: Internal Medicine

## 2023-04-15 ENCOUNTER — Encounter: Payer: Medicare HMO | Admitting: Internal Medicine

## 2023-05-14 ENCOUNTER — Other Ambulatory Visit: Payer: Self-pay | Admitting: Internal Medicine

## 2023-06-18 ENCOUNTER — Ambulatory Visit: Payer: Medicare HMO | Admitting: Internal Medicine

## 2023-06-18 ENCOUNTER — Encounter: Payer: Self-pay | Admitting: Internal Medicine

## 2023-06-18 VITALS — BP 128/68 | HR 68 | Resp 16 | Ht 67.5 in | Wt 192.6 lb

## 2023-06-18 DIAGNOSIS — R300 Dysuria: Secondary | ICD-10-CM

## 2023-06-18 DIAGNOSIS — Z0000 Encounter for general adult medical examination without abnormal findings: Secondary | ICD-10-CM | POA: Diagnosis not present

## 2023-06-18 DIAGNOSIS — M545 Low back pain, unspecified: Secondary | ICD-10-CM

## 2023-06-18 DIAGNOSIS — T7840XS Allergy, unspecified, sequela: Secondary | ICD-10-CM

## 2023-06-18 DIAGNOSIS — K760 Fatty (change of) liver, not elsewhere classified: Secondary | ICD-10-CM

## 2023-06-18 DIAGNOSIS — R140 Abdominal distension (gaseous): Secondary | ICD-10-CM

## 2023-06-18 DIAGNOSIS — I484 Atypical atrial flutter: Secondary | ICD-10-CM

## 2023-06-18 DIAGNOSIS — E7800 Pure hypercholesterolemia, unspecified: Secondary | ICD-10-CM

## 2023-06-18 DIAGNOSIS — F32A Depression, unspecified: Secondary | ICD-10-CM

## 2023-06-18 MED ORDER — AZELASTINE HCL 0.1 % NA SOLN: 1.0000 | Freq: Two times a day (BID) | NASAL | 1 refills | Status: DC

## 2023-06-18 MED ORDER — FLUOXETINE HCL 20 MG PO CAPS: 20.0000 mg | ORAL_CAPSULE | Freq: Every day | ORAL | 1 refills | Status: DC
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
## 2023-06-19 ENCOUNTER — Encounter: Payer: Self-pay | Admitting: Internal Medicine

## 2023-06-19 DIAGNOSIS — T7840XA Allergy, unspecified, initial encounter: Secondary | ICD-10-CM | POA: Insufficient documentation

## 2023-06-19 DIAGNOSIS — R300 Dysuria: Secondary | ICD-10-CM | POA: Insufficient documentation

## 2023-06-19 LAB — TSH: TSH: 1.11 m[IU]/L (ref 0.40–4.50)

## 2023-06-19 LAB — CBC WITH DIFFERENTIAL/PLATELET
Absolute Lymphocytes: 1898 {cells}/uL (ref 850–3900)
Absolute Monocytes: 857 {cells}/uL (ref 200–950)
Basophils Absolute: 84 {cells}/uL (ref 0–200)
Basophils Relative: 1 %
Eosinophils Absolute: 168 {cells}/uL (ref 15–500)
Eosinophils Relative: 2 %
HCT: 39.2 % (ref 35.0–45.0)
Hemoglobin: 12.9 g/dL (ref 11.7–15.5)
MCH: 28.2 pg (ref 27.0–33.0)
MCHC: 32.9 g/dL (ref 32.0–36.0)
MCV: 85.8 fL (ref 80.0–100.0)
MPV: 9.5 fL (ref 7.5–12.5)
Monocytes Relative: 10.2 %
Neutro Abs: 5393 {cells}/uL (ref 1500–7800)
Neutrophils Relative %: 64.2 %
Platelets: 286 10*3/uL (ref 140–400)
RBC: 4.57 10*6/uL (ref 3.80–5.10)
RDW: 13.3 % (ref 11.0–15.0)
Total Lymphocyte: 22.6 %
WBC: 8.4 10*3/uL (ref 3.8–10.8)

## 2023-06-19 LAB — URINALYSIS, ROUTINE W REFLEX MICROSCOPIC
Bilirubin Urine: NEGATIVE
Glucose, UA: NEGATIVE
Hgb urine dipstick: NEGATIVE
Ketones, ur: NEGATIVE
Leukocytes,Ua: NEGATIVE
Nitrite: NEGATIVE
Protein, ur: NEGATIVE
Specific Gravity, Urine: 1.01 (ref 1.001–1.035)
pH: 6.5 (ref 5.0–8.0)

## 2023-06-19 LAB — BASIC METABOLIC PANEL WITH GFR
BUN: 15 mg/dL (ref 7–25)
CO2: 24 mmol/L (ref 20–32)
Calcium: 9.2 mg/dL (ref 8.6–10.4)
Chloride: 98 mmol/L (ref 98–110)
Creat: 0.59 mg/dL (ref 0.50–1.05)
Glucose, Bld: 90 mg/dL (ref 65–99)
Potassium: 4.4 mmol/L (ref 3.5–5.3)
Sodium: 131 mmol/L — ABNORMAL LOW (ref 135–146)
eGFR: 97 mL/min/{1.73_m2} (ref >=60)

## 2023-06-19 LAB — LIPID PANEL
Cholesterol: 189 mg/dL (ref <200)
HDL: 70 mg/dL (ref >=50)
LDL Cholesterol (Calc): 99 mg/dL
Non-HDL Cholesterol (Calc): 119 mg/dL (ref <130)
Total CHOL/HDL Ratio: 2.7 (calc) (ref <5.0)
Triglycerides: 107 mg/dL (ref <150)

## 2023-06-19 LAB — HEPATIC FUNCTION PANEL
AG Ratio: 2.3 (calc) (ref 1.0–2.5)
ALT: 12 U/L (ref 6–29)
AST: 17 U/L (ref 10–35)
Albumin: 4.6 g/dL (ref 3.6–5.1)
Alkaline phosphatase (APISO): 90 U/L (ref 37–153)
Bilirubin, Direct: 0.1 mg/dL (ref 0.0–0.2)
Globulin: 2 g/dL (ref 1.9–3.7)
Indirect Bilirubin: 0.4 mg/dL (ref 0.2–1.2)
Total Bilirubin: 0.5 mg/dL (ref 0.2–1.2)
Total Protein: 6.6 g/dL (ref 6.1–8.1)

## 2023-06-19 LAB — URINE CULTURE
MICRO NUMBER:: 16343035
Result:: NO GROWTH
SPECIMEN QUALITY:: ADEQUATE

## 2023-06-19 LAB — MAGNESIUM: Magnesium: 1.9 mg/dL (ref 1.5–2.5)

## 2023-06-19 NOTE — Assessment & Plan Note (Signed)
 Taking benadryl. Trial of astelin  nasal spary. Stop afrin.

## 2023-06-19 NOTE — Assessment & Plan Note (Signed)
 Physical today 06/18/23.  Colonoscopy 2021.  Recommended f/u in 5 years. Mammogram 07/02/22 - birads I. Mammogram - [facility].

## 2023-06-19 NOTE — Assessment & Plan Note (Signed)
 Previous ultrasound - revealed fatty liver.  Diet and exercise.  Follow liver panel. Recheck today.

## 2023-06-19 NOTE — Assessment & Plan Note (Signed)
 Persistent abdominal bloating.  Discussed persistent intermittent symptoms. Discussed further w/up and evaluation. Discussed scanning. She wants to hold at this time. Keep bowels moving.

## 2023-06-19 NOTE — Assessment & Plan Note (Signed)
Sees cardiology for f/u afib/aflutter s/p ablation on dofetilide and eliquis.  Stable. Appears to be in SR today.  

## 2023-06-19 NOTE — Assessment & Plan Note (Signed)
 Continue prozac .  Increased stress.  Overall appears to be handling things relatively well.  Currently stable. Follow.

## 2023-06-19 NOTE — Assessment & Plan Note (Signed)
 Some dysuria. Feels is "settling down". Check urine to confirm if infection present.

## 2023-06-19 NOTE — Assessment & Plan Note (Signed)
 Low cholesterol diet and exercise.  Follow lipid panel.

## 2023-06-19 NOTE — Assessment & Plan Note (Signed)
 She has been to PT. Did help. Discussed doing exercises at home. Follow.

## 2023-10-26 ENCOUNTER — Ambulatory Visit (INDEPENDENT_AMBULATORY_CARE_PROVIDER_SITE_OTHER): Admitting: Internal Medicine

## 2023-10-26 VITALS — BP 118/70 | HR 66 | Resp 16 | Ht 67.5 in | Wt 198.6 lb

## 2023-10-26 DIAGNOSIS — W5581XA Bitten by other mammals, initial encounter: Secondary | ICD-10-CM

## 2023-10-26 DIAGNOSIS — K760 Fatty (change of) liver, not elsewhere classified: Secondary | ICD-10-CM

## 2023-10-26 DIAGNOSIS — I484 Atypical atrial flutter: Secondary | ICD-10-CM | POA: Diagnosis not present

## 2023-10-26 DIAGNOSIS — I4891 Unspecified atrial fibrillation: Secondary | ICD-10-CM

## 2023-10-26 DIAGNOSIS — Z1231 Encounter for screening mammogram for malignant neoplasm of breast: Secondary | ICD-10-CM

## 2023-10-26 DIAGNOSIS — E7800 Pure hypercholesterolemia, unspecified: Secondary | ICD-10-CM | POA: Diagnosis not present

## 2023-10-26 DIAGNOSIS — T148XXA Other injury of unspecified body region, initial encounter: Secondary | ICD-10-CM

## 2023-10-26 DIAGNOSIS — F32A Depression, unspecified: Secondary | ICD-10-CM

## 2023-10-26 DIAGNOSIS — S6990XA Unspecified injury of unspecified wrist, hand and finger(s), initial encounter: Secondary | ICD-10-CM

## 2023-10-26 LAB — BASIC METABOLIC PANEL WITH GFR
BUN: 8 mg/dL (ref 6–23)
CO2: 28 meq/L (ref 19–32)
Calcium: 9 mg/dL (ref 8.4–10.5)
Chloride: 99 meq/L (ref 96–112)
Creatinine, Ser: 0.57 mg/dL (ref 0.40–1.20)
GFR: 92.21 mL/min (ref >60.00)
Glucose, Bld: 103 mg/dL — ABNORMAL HIGH (ref 70–99)
Potassium: 5.1 meq/L (ref 3.5–5.1)
Sodium: 134 meq/L — ABNORMAL LOW (ref 135–145)

## 2023-10-26 LAB — LIPID PANEL
Cholesterol: 192 mg/dL (ref 0–200)
HDL: 72.8 mg/dL (ref >39.00)
LDL Cholesterol: 95 mg/dL (ref 0–99)
NonHDL: 118.76
Total CHOL/HDL Ratio: 3
Triglycerides: 121 mg/dL (ref 0.0–149.0)
VLDL: 24.2 mg/dL (ref 0.0–40.0)

## 2023-10-26 LAB — CBC WITH DIFFERENTIAL/PLATELET
Basophils Absolute: 0.1 K/uL (ref 0.0–0.1)
Basophils Relative: 1.8 % (ref 0.0–3.0)
Eosinophils Absolute: 0.2 K/uL (ref 0.0–0.7)
Eosinophils Relative: 4.9 % (ref 0.0–5.0)
HCT: 39.9 % (ref 36.0–46.0)
Hemoglobin: 12.9 g/dL (ref 12.0–15.0)
Lymphocytes Relative: 31 % (ref 12.0–46.0)
Lymphs Abs: 1.5 K/uL (ref 0.7–4.0)
MCHC: 32.4 g/dL (ref 30.0–36.0)
MCV: 85.3 fl (ref 78.0–100.0)
Monocytes Absolute: 0.5 K/uL (ref 0.1–1.0)
Monocytes Relative: 10.8 % (ref 3.0–12.0)
Neutro Abs: 2.5 K/uL (ref 1.4–7.7)
Neutrophils Relative %: 51.5 % (ref 43.0–77.0)
Platelets: 348 K/uL (ref 150.0–400.0)
RBC: 4.67 Mil/uL (ref 3.87–5.11)
RDW: 14.5 % (ref 11.5–15.5)
WBC: 4.9 K/uL (ref 4.0–10.5)

## 2023-10-26 LAB — HEPATIC FUNCTION PANEL
ALT: 13 U/L (ref 0–35)
AST: 17 U/L (ref 0–37)
Albumin: 4.2 g/dL (ref 3.5–5.2)
Alkaline Phosphatase: 95 U/L (ref 39–117)
Bilirubin, Direct: 0.1 mg/dL (ref 0.0–0.3)
Total Bilirubin: 0.4 mg/dL (ref 0.2–1.2)
Total Protein: 6.8 g/dL (ref 6.0–8.3)

## 2023-10-26 MED ORDER — FLUOXETINE HCL 20 MG PO CAPS: 20.0000 mg | ORAL_CAPSULE | Freq: Every day | ORAL | 1 refills | Status: DC

## 2023-10-26 MED ORDER — AZELASTINE HCL 0.1 % NA SOLN: 1.0000 | Freq: Two times a day (BID) | NASAL | 1 refills | Status: AC
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
## 2023-10-29 ENCOUNTER — Ambulatory Visit: Payer: Self-pay | Admitting: Internal Medicine

## 2023-10-29 DIAGNOSIS — E7800 Pure hypercholesterolemia, unspecified: Secondary | ICD-10-CM

## 2023-10-30 NOTE — Progress Notes (Signed)
 Called pt and husband answered the phone he states she wasn't available and ill have to call back 11/03/2023 to give results to her. Will call back

## 2023-11-01 ENCOUNTER — Encounter: Payer: Self-pay | Admitting: Internal Medicine

## 2023-11-01 DIAGNOSIS — T148XXA Other injury of unspecified body region, initial encounter: Secondary | ICD-10-CM | POA: Insufficient documentation

## 2023-11-01 NOTE — Assessment & Plan Note (Signed)
 Previous ultrasound revealed fatty liver.  Diet and exercise. Recheck liver panel today.

## 2023-11-01 NOTE — Assessment & Plan Note (Signed)
 Ground hog bite - index finger. Finger doing better. Receiving rabies vaccine. Conitnues on augmentin.

## 2023-11-01 NOTE — Assessment & Plan Note (Signed)
 Continue prozac .  Increased stress with recent deaths as outlined. Overall appears to be handling things relatively well.  Currently stable. Follow.

## 2023-11-01 NOTE — Assessment & Plan Note (Signed)
 Sees cardiology for f/u afib/aflutter s/p ablation on dofetilide and eliquis.  Stable.  Appears to be in SR today. No changes.

## 2023-11-01 NOTE — Assessment & Plan Note (Signed)
 Low cholesterol diet and exercise.  Follow lipid panel.

## 2023-11-01 NOTE — Assessment & Plan Note (Signed)
 Followed by cardiology for f/u afib/aflutter. s/p ablation on dofetilide and eliquis. Stable.

## 2024-02-15 ENCOUNTER — Ambulatory Visit: Payer: Medicare HMO

## 2024-02-15 VITALS — Ht 67.5 in | Wt 200.0 lb

## 2024-02-15 DIAGNOSIS — Z0000 Encounter for general adult medical examination without abnormal findings: Secondary | ICD-10-CM

## 2024-02-15 NOTE — Patient Instructions (Signed)
 Caitlin [name],  Thank you for taking the time for your Medicare Wellness Visit. I appreciate your continued commitment to your health goals. Please review the care plan we discussed, and feel free to reach out if I can assist you further.  Please note that Annual Wellness Visits do not include a physical exam. Some assessments may be limited, especially if the visit was conducted virtually. If needed, we may recommend an in-person follow-up with your provider.  Ongoing Care Seeing your primary care provider every 3 to 6 months helps us  monitor your health and provide consistent, personalized care.  Remember to update your shingles vaccines.   Referrals If a referral was made during today's visit and you haven't received any updates within two weeks, please contact the referred provider directly to check on the status.  Recommended Screenings:  Health Maintenance  Topic Date Due   Zoster (Shingles) Vaccine (1 of 2) Never done   Osteoporosis screening with Bone Density Scan  Never done   COVID-19 Vaccine (3 - Moderna risk series) 11/10/2019   Flu Shot  05/31/2024*   Pneumococcal Vaccine for age over 23 (1 of 1 - PCV) 06/17/2024*   Breast Cancer Screening  07/01/2024   Colon Cancer Screening  02/09/2025   Medicare Annual Wellness Visit  02/14/2025   DTaP/Tdap/Td vaccine (2 - Td or Tdap) 10/19/2033   Hepatitis C Screening  Completed   Meningitis B Vaccine  Aged Out  *Topic was postponed. The date shown is not the original due date.       02/14/2024    9:50 PM  Advanced Directives  Does Patient Have a Medical Advance Directive? Yes  Type of Estate Agent of [place];Living will  Does patient want to make changes to medical advance directive? No - Patient declined  Copy of Healthcare Power of Attorney in Chart? Yes - validated most recent copy scanned in chart (See row information)    Vision: Annual vision screenings are recommended for early detection of glaucoma,  cataracts, and diabetic retinopathy. These exams can also reveal signs of chronic conditions such as diabetes and high blood pressure.  Dental: Annual dental screenings help detect early signs of oral cancer, gum disease, and other conditions linked to overall health, including heart disease and diabetes.  Please see the attached documents for additional preventive care recommendations.
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
## 2024-03-15 ENCOUNTER — Ambulatory Visit: Admitting: Internal Medicine

## 2024-03-15 ENCOUNTER — Encounter: Payer: Self-pay | Admitting: Internal Medicine

## 2024-03-15 VITALS — BP 120/70 | HR 76 | Temp 98.1°F | Ht 67.5 in | Wt 197.8 lb

## 2024-03-15 DIAGNOSIS — R400 Somnolence: Secondary | ICD-10-CM | POA: Diagnosis not present

## 2024-03-15 DIAGNOSIS — R079 Chest pain, unspecified: Secondary | ICD-10-CM

## 2024-03-15 DIAGNOSIS — E7800 Pure hypercholesterolemia, unspecified: Secondary | ICD-10-CM

## 2024-03-15 DIAGNOSIS — Z23 Encounter for immunization: Secondary | ICD-10-CM | POA: Diagnosis not present

## 2024-03-15 DIAGNOSIS — F32A Depression, unspecified: Secondary | ICD-10-CM

## 2024-03-15 DIAGNOSIS — I4891 Unspecified atrial fibrillation: Secondary | ICD-10-CM

## 2024-03-15 DIAGNOSIS — K760 Fatty (change of) liver, not elsewhere classified: Secondary | ICD-10-CM

## 2024-03-15 LAB — HEPATIC FUNCTION PANEL
ALT: 13 U/L (ref 3–35)
AST: 18 U/L (ref 5–37)
Albumin: 4.4 g/dL (ref 3.5–5.2)
Alkaline Phosphatase: 86 U/L (ref 39–117)
Bilirubin, Direct: 0.1 mg/dL (ref 0.1–0.3)
Total Bilirubin: 0.4 mg/dL (ref 0.2–1.2)
Total Protein: 7 g/dL (ref 6.0–8.3)

## 2024-03-15 LAB — BASIC METABOLIC PANEL WITH GFR
BUN: 12 mg/dL (ref 6–23)
CO2: 26 meq/L (ref 19–32)
Calcium: 9.4 mg/dL (ref 8.4–10.5)
Chloride: 100 meq/L (ref 96–112)
Creatinine, Ser: 0.59 mg/dL (ref 0.40–1.20)
GFR: 91.2 mL/min
Glucose, Bld: 93 mg/dL (ref 70–99)
Potassium: 4.5 meq/L (ref 3.5–5.1)
Sodium: 133 meq/L — ABNORMAL LOW (ref 135–145)

## 2024-03-15 LAB — LIPID PANEL
Cholesterol: 190 mg/dL (ref 28–200)
HDL: 75.4 mg/dL
LDL Cholesterol: 93 mg/dL (ref 10–99)
NonHDL: 114.32
Total CHOL/HDL Ratio: 3
Triglycerides: 109 mg/dL (ref 10.0–149.0)
VLDL: 21.8 mg/dL (ref 0.0–40.0)

## 2024-03-15 LAB — TSH: TSH: 1.42 u[IU]/mL (ref 0.35–5.50)

## 2024-03-15 LAB — MAGNESIUM: Magnesium: 1.9 mg/dL (ref 1.5–2.5)

## 2024-03-15 MED ORDER — FLUOXETINE HCL 20 MG PO CAPS: 20.0000 mg | ORAL_CAPSULE | Freq: Every day | ORAL | 1 refills | Status: AC
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
## 2024-03-15 NOTE — Patient Instructions (Signed)
Pepcid (famotidine) '20mg'$  - take one tablet 30 minutes before breakfast

## 2024-03-16 ENCOUNTER — Ambulatory Visit: Payer: Self-pay | Admitting: Internal Medicine

## 2024-03-20 ENCOUNTER — Telehealth: Payer: Self-pay | Admitting: Internal Medicine

## 2024-03-20 ENCOUNTER — Encounter: Payer: Self-pay | Admitting: Internal Medicine

## 2024-03-20 NOTE — Telephone Encounter (Signed)
 I saw Caitlin [name] last week. She sees a cardiologist closer to where she lives. I had discussed with her regarding f/u. Please confirm if she has scheduled an appt wit her cardiologist and confirm name and location. Please forward my last note and last EKG. Please contact office and notify them information being sent for f/u appt.

## 2024-03-20 NOTE — Assessment & Plan Note (Signed)
 Low cholesterol diet and exercise.  Follow lipid panel.

## 2024-03-20 NOTE — Assessment & Plan Note (Signed)
Previous ultrasound revealed fatty liver.   Diet and exercise.  Follow liver panel.   

## 2024-03-20 NOTE — Assessment & Plan Note (Signed)
 Followed by cardiology for f/u afib/aflutter. s/p ablation on dofetilide and eliquis. Has noticied some brief 10 second episodes of chest tightness as outlined. Discussed. EKG - SR/SB no acute ischemic changes. Seeing cardiology. Discussed further w/up, including possible echo, zio monitor and need for f/u with cardiology. Agreeable.
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
## 2024-03-20 NOTE — Assessment & Plan Note (Signed)
 Continue prozac .  Increased stress with recent deaths as outlined. Overall appears to be handling things relatively well.  Follow.

## 2024-03-21 NOTE — Telephone Encounter (Signed)
 Ok to send notes and EKG. Let me know if anything more needed. Needs an earlier f/u appt with her cardiologist.

## 2024-03-21 NOTE — Telephone Encounter (Signed)
Noted. Mychart message sent to pt.

## 2024-03-23 NOTE — Telephone Encounter (Signed)
 Pt is aware. Pt was advised if she has any trouble getting a sooner appt to please let us  know.

## 2024-03-24 ENCOUNTER — Ambulatory Visit: Admitting: Sleep Medicine

## 2024-03-24 ENCOUNTER — Encounter: Payer: Self-pay | Admitting: Sleep Medicine

## 2024-03-24 VITALS — BP 112/70 | HR 60 | Temp 98.4°F | Ht 67.5 in | Wt 200.0 lb

## 2024-03-24 DIAGNOSIS — I4891 Unspecified atrial fibrillation: Secondary | ICD-10-CM

## 2024-03-24 DIAGNOSIS — E669 Obesity, unspecified: Secondary | ICD-10-CM

## 2024-03-24 DIAGNOSIS — G4710 Hypersomnia, unspecified: Secondary | ICD-10-CM | POA: Diagnosis not present

## 2024-03-24 DIAGNOSIS — G4733 Obstructive sleep apnea (adult) (pediatric): Secondary | ICD-10-CM

## 2024-03-24 DIAGNOSIS — I10 Essential (primary) hypertension: Secondary | ICD-10-CM

## 2024-03-24 NOTE — Patient Instructions (Signed)
 Caitlin [name]

## 2024-03-24 NOTE — Progress Notes (Signed)
 "      Name:Caitlin [name] MRN: [id number] DOB: November 30, 1955   CHIEF COMPLAINT:  EXCESSIVE DAYTIME SLEEPINESS   HISTORY OF PRESENT ILLNESS: Caitlin [name] is a 69 y.o. w/ a h/o atrial fibrillation, anxiety, depression and obesity who presents for c/o loud snoring and excessive daytime sleepiness which has been present for several years. Reports nocturnal awakenings due to nocturia, however does not have difficulty falling back to sleep. Denies any significant weight changes. Admits to dry mouth. Denies morning headaches, RLS symptoms, dream enactment, cataplexy, hypnagogic or hypnapompic hallucinations. Denies a family history of sleep apnea. Reports drowsy driving. Drinks 1 cup of coffee and a few glasses of tea daily, denies alcohol, tobacco or illicit drug use.   Bedtime 11 pm-12 am  Sleep onset 5 mins Rise time 7:30 am   EPWORTH SLEEP SCORE 10    03/24/2024   10:38 AM  Results of the Epworth flowsheet  Sitting and reading 2  Watching TV 2  Sitting, inactive in a public place (e.g. a theatre or a meeting) 1  As a passenger in a car for an hour without a break 1  Lying down to rest in the afternoon when circumstances permit 3  Sitting and talking to someone 0  Sitting quietly after a lunch without alcohol 1  In a car, while stopped for a few minutes in traffic 0  Total score 10      PAST MEDICAL HISTORY :   has a past medical history of Allergy, Arthritis, Atrial fibrillation (HCC), Depression, GERD (gastroesophageal reflux disease), Head injury, History of migraine headaches, Hyperlipidemia, Myocardial infarction (HCC), and S/P diskectomy (09/20/2015).  has a past surgical history that includes Cholecystectomy; head injury (1996); Tubal ligation; Colonoscopy with propofol  (N/A, 02/10/2020); and Spine surgery. Prior to Admission medications  Medication Sig Start Date End Date Taking? Authorizing Provider  aspirin 325 MG EC tablet Take 325 mg by mouth as needed.   Yes [provider]  azelastine  (ASTELIN ) 0.1 % nasal spray Place 1 spray into both nostrils 2 (two) times daily. Use in each nostril as directed Patient taking differently: Place 1 spray into both nostrils 2 (two) times daily as needed. Use in each nostril as directed 10/26/23  Yes Glendia Shad, MD  diltiazem (CARDIZEM) 60 MG tablet Take 60 mg by mouth 2 (two) times daily.   Yes [provider]  ELIQUIS 5 MG TABS tablet Take 5 mg by mouth 2 (two) times daily. 12/16/19  Yes [provider]  FLUoxetine  (PROZAC ) 20 MG capsule Take 1 capsule (20 mg total) by mouth daily. 03/15/24  Yes Glendia Shad, MD  ibuprofen (ADVIL,MOTRIN) 200 MG tablet Take 400 mg by mouth every 6 (six) hours as needed.   Yes [provider]  Magnesium 500 MG CAPS Take 500 mg by mouth daily.   Yes [provider]  Multiple Vitamins-Minerals (PRESERVISION AREDS PO) Take by mouth.   Yes [provider]  senna-docusate (SENOKOT-S) 8.6-50 MG tablet Take 1 tablet by mouth daily.   Yes [provider]  UNABLE TO FIND Med Name: RED GRAPE POWDER   Yes [provider]  zinc gluconate 50 MG tablet Take by mouth.   Yes [provider]   Allergies[1]  FAMILY HISTORY:  family history includes Arthritis in her mother; Breast cancer in her maternal aunt; Cancer in her father; Colon cancer in her father; Diabetes in her brother and mother; Hearing loss in her father; Heart disease in her brother and  mother; Heart failure in her father; Osteoarthritis in her brother. SOCIAL HISTORY:  reports that she has never smoked. She has never used smokeless tobacco. She reports that she does not drink alcohol and does not use drugs.   Review of Systems:  Gen:  Denies  fever, sweats, chills weight loss  HEENT: Denies blurred vision, double vision, ear pain, eye pain, hearing loss, nose bleeds, sore throat Cardiac:  No dizziness, chest pain or heaviness, chest tightness,edema, No  JVD Resp:   No cough, -sputum production, -shortness of breath,-wheezing, -hemoptysis,  Gi: Denies swallowing difficulty, stomach pain, nausea or vomiting, diarrhea, constipation, bowel incontinence Gu:  Denies bladder incontinence, burning urine Ext:   Denies Joint pain, stiffness or swelling Skin: Denies  skin rash, easy bruising or bleeding or hives Endoc:  Denies polyuria, polydipsia , polyphagia or weight change Psych:   Denies depression, insomnia or hallucinations  Other:  All other systems negative  VITAL SIGNS: BP 112/70   Pulse 60   Temp 98.4 F (36.9 C)   Ht 5' 7.5 (1.715 m)   Wt 200 lb (90.7 kg)   LMP 04/20/2007   SpO2 97%   BMI 30.86 kg/m    Physical Examination:   General Appearance: No distress  EYES PERRLA, EOM intact.   NECK Supple, No JVD Pulmonary: normal breath sounds, No wheezing.  CardiovascularNormal S1,S2.  No m/r/g.   Abdomen: Benign, Soft, non-tender. Skin:   warm, no rashes, no ecchymosis  Extremities: normal, no cyanosis, clubbing. Neuro:without focal findings,  speech normal  PSYCHIATRIC: Mood, affect within normal limits.   ASSESSMENT AND PLAN  OSA I suspect that OSA is likely present due to clinical presentation. Discussed the consequences of untreated sleep apnea. Advised not to drive drowsy for safety of patient and others. Will complete further evaluation with a home sleep study and follow up to review results.    HTN Stable, on current management. Following with PCP.    MEDICATION ADJUSTMENTS/LABS AND TESTS ORDERED: Recommend Sleep Study   Patient  satisfied with Plan of action and management. All questions answered  Follow up to review HST results and treatment plan.   I spent a total of 63 minutes reviewing chart data, face-to-face evaluation with the patient, counseling and coordination of care as detailed above.    Cilicia Borden, M.D.  Sleep Medicine [facility]           [1]   Allergies Allergen Reactions   Epinephrine     Other reaction(s): Other (See Comments) heart races   Aciphex [Rabeprazole] Other (See Comments)    Headaches   Albuterol Other (See Comments)    Intolerance   Amiodarone     Other reaction(s): Tremor (intolerance) Dizziness, numbness of fingers, cognitive changes   "

## 2024-07-13 ENCOUNTER — Encounter: Admitting: Internal Medicine

## 2025-02-20 ENCOUNTER — Ambulatory Visit
